# Patient Record
Sex: Female | Born: 1970
Health system: Southern US, Community
[De-identification: ages and names within clinical notes are randomized; demographics above are authoritative.]

## PROBLEM LIST (undated history)

## (undated) DIAGNOSIS — E039 Hypothyroidism, unspecified: Secondary | ICD-10-CM

## (undated) DIAGNOSIS — E785 Hyperlipidemia, unspecified: Secondary | ICD-10-CM

## (undated) DIAGNOSIS — C439 Malignant melanoma of skin, unspecified: Secondary | ICD-10-CM

## (undated) DIAGNOSIS — C50919 Malignant neoplasm of unspecified site of unspecified female breast: Secondary | ICD-10-CM

## (undated) DIAGNOSIS — K222 Esophageal obstruction: Secondary | ICD-10-CM

## (undated) DIAGNOSIS — I10 Essential (primary) hypertension: Secondary | ICD-10-CM

## (undated) HISTORY — DX: Hypothyroidism, unspecified: E03.9

## (undated) HISTORY — PX: APPENDECTOMY: SHX54

## (undated) HISTORY — DX: Hyperlipidemia, unspecified: E78.5

## (undated) HISTORY — PX: THORACOTOMY: SUR1349

## (undated) HISTORY — DX: Essential (primary) hypertension: I10

## (undated) HISTORY — DX: Malignant neoplasm of unspecified site of unspecified female breast: C50.919

## (undated) HISTORY — PX: THYROIDECTOMY: SHX17

## (undated) HISTORY — PX: TUBAL LIGATION: SHX77

## (undated) HISTORY — PX: NOVASURE ABLATION: SHX5394

## (undated) HISTORY — PX: SPLENECTOMY: SUR1306

## (undated) HISTORY — PX: MASTECTOMY: SHX3

## (undated) HISTORY — PX: ESOPHAGEAL DILATION: SHX303

## (undated) HISTORY — PX: OTHER SURGICAL HISTORY: SHX169

## (undated) HISTORY — DX: Esophageal obstruction: K22.2

## (undated) HISTORY — DX: Malignant melanoma of skin, unspecified: C43.9

## (undated) HISTORY — PX: TONSILLECTOMY: SUR1361

---

## 1998-02-26 ENCOUNTER — Other Ambulatory Visit: Admission: RE | Admit: 1998-02-26 | Discharge: 1998-02-26 | Payer: Self-pay | Admitting: Obstetrics and Gynecology

## 1998-05-17 ENCOUNTER — Ambulatory Visit (HOSPITAL_COMMUNITY): Admission: RE | Admit: 1998-05-17 | Discharge: 1998-05-17 | Payer: Self-pay | Admitting: Gastroenterology

## 1999-03-02 ENCOUNTER — Other Ambulatory Visit: Admission: RE | Admit: 1999-03-02 | Discharge: 1999-03-02 | Payer: Self-pay | Admitting: Obstetrics and Gynecology

## 1999-04-07 ENCOUNTER — Encounter (INDEPENDENT_AMBULATORY_CARE_PROVIDER_SITE_OTHER): Payer: Self-pay

## 1999-04-07 ENCOUNTER — Ambulatory Visit (HOSPITAL_COMMUNITY): Admission: RE | Admit: 1999-04-07 | Discharge: 1999-04-07 | Payer: Self-pay | Admitting: Surgery

## 1999-08-08 ENCOUNTER — Encounter: Admission: RE | Admit: 1999-08-08 | Discharge: 1999-08-08 | Payer: Self-pay | Admitting: Oncology

## 2000-01-26 ENCOUNTER — Encounter: Admission: RE | Admit: 2000-01-26 | Discharge: 2000-01-26 | Payer: Self-pay | Admitting: Oncology

## 2000-01-26 ENCOUNTER — Encounter: Payer: Self-pay | Admitting: Oncology

## 2000-03-22 ENCOUNTER — Other Ambulatory Visit: Admission: RE | Admit: 2000-03-22 | Discharge: 2000-03-22 | Payer: Self-pay | Admitting: Obstetrics and Gynecology

## 2000-06-13 ENCOUNTER — Encounter (HOSPITAL_COMMUNITY): Payer: Self-pay | Admitting: Oncology

## 2000-06-13 ENCOUNTER — Encounter: Admission: RE | Admit: 2000-06-13 | Discharge: 2000-06-13 | Payer: Self-pay | Admitting: Oncology

## 2000-07-20 ENCOUNTER — Encounter (HOSPITAL_COMMUNITY): Payer: Self-pay | Admitting: Oncology

## 2000-07-20 ENCOUNTER — Encounter: Admission: RE | Admit: 2000-07-20 | Discharge: 2000-07-20 | Payer: Self-pay | Admitting: Oncology

## 2000-12-19 ENCOUNTER — Encounter (HOSPITAL_COMMUNITY): Payer: Self-pay | Admitting: Oncology

## 2000-12-19 ENCOUNTER — Encounter: Admission: RE | Admit: 2000-12-19 | Discharge: 2000-12-19 | Payer: Self-pay | Admitting: Oncology

## 2001-03-21 ENCOUNTER — Other Ambulatory Visit: Admission: RE | Admit: 2001-03-21 | Discharge: 2001-03-21 | Payer: Self-pay | Admitting: Obstetrics and Gynecology

## 2001-04-01 ENCOUNTER — Encounter: Admission: RE | Admit: 2001-04-01 | Discharge: 2001-04-01 | Payer: Self-pay | Admitting: Oncology

## 2001-04-01 ENCOUNTER — Encounter (HOSPITAL_COMMUNITY): Admission: RE | Admit: 2001-04-01 | Discharge: 2001-05-01 | Payer: Self-pay | Admitting: Oncology

## 2001-12-30 ENCOUNTER — Ambulatory Visit (HOSPITAL_COMMUNITY): Admission: RE | Admit: 2001-12-30 | Discharge: 2001-12-30 | Payer: Self-pay | Admitting: Gastroenterology

## 2002-01-08 ENCOUNTER — Encounter: Admission: RE | Admit: 2002-01-08 | Discharge: 2002-01-08 | Payer: Self-pay | Admitting: Oncology

## 2002-01-28 ENCOUNTER — Ambulatory Visit (HOSPITAL_COMMUNITY): Admission: RE | Admit: 2002-01-28 | Discharge: 2002-01-28 | Payer: Self-pay | Admitting: Gastroenterology

## 2002-03-24 ENCOUNTER — Encounter: Admission: RE | Admit: 2002-03-24 | Discharge: 2002-03-24 | Payer: Self-pay | Admitting: Oncology

## 2002-03-24 ENCOUNTER — Encounter (HOSPITAL_COMMUNITY): Payer: Self-pay | Admitting: Oncology

## 2002-04-18 ENCOUNTER — Ambulatory Visit (HOSPITAL_COMMUNITY): Admission: RE | Admit: 2002-04-18 | Discharge: 2002-04-18 | Payer: Self-pay | Admitting: Gastroenterology

## 2002-08-29 ENCOUNTER — Ambulatory Visit (HOSPITAL_COMMUNITY): Admission: RE | Admit: 2002-08-29 | Discharge: 2002-08-29 | Payer: Self-pay | Admitting: Gastroenterology

## 2002-10-14 ENCOUNTER — Other Ambulatory Visit: Admission: RE | Admit: 2002-10-14 | Discharge: 2002-10-14 | Payer: Self-pay | Admitting: Obstetrics and Gynecology

## 2004-03-07 ENCOUNTER — Encounter: Admission: RE | Admit: 2004-03-07 | Discharge: 2004-03-07 | Payer: Self-pay | Admitting: Oncology

## 2004-03-07 ENCOUNTER — Encounter (HOSPITAL_COMMUNITY): Admission: RE | Admit: 2004-03-07 | Discharge: 2004-04-06 | Payer: Self-pay | Admitting: Oncology

## 2004-07-26 ENCOUNTER — Other Ambulatory Visit: Admission: RE | Admit: 2004-07-26 | Discharge: 2004-07-26 | Payer: Self-pay | Admitting: Obstetrics and Gynecology

## 2005-03-14 ENCOUNTER — Ambulatory Visit: Payer: Self-pay | Admitting: Oncology

## 2005-03-17 ENCOUNTER — Encounter: Admission: RE | Admit: 2005-03-17 | Discharge: 2005-03-17 | Payer: Self-pay | Admitting: Oncology

## 2005-03-17 ENCOUNTER — Ambulatory Visit (HOSPITAL_COMMUNITY): Payer: Self-pay | Admitting: Oncology

## 2005-09-13 ENCOUNTER — Other Ambulatory Visit: Admission: RE | Admit: 2005-09-13 | Discharge: 2005-09-13 | Payer: Self-pay | Admitting: Obstetrics and Gynecology

## 2005-10-30 ENCOUNTER — Ambulatory Visit: Payer: Self-pay | Admitting: Cardiology

## 2005-11-28 ENCOUNTER — Ambulatory Visit: Payer: Self-pay | Admitting: Cardiology

## 2006-02-21 ENCOUNTER — Encounter: Payer: Self-pay | Admitting: Surgery

## 2006-03-02 ENCOUNTER — Ambulatory Visit: Payer: Self-pay | Admitting: Cardiology

## 2006-03-02 ENCOUNTER — Encounter: Admission: RE | Admit: 2006-03-02 | Discharge: 2006-03-02 | Payer: Self-pay | Admitting: Oncology

## 2006-03-02 ENCOUNTER — Ambulatory Visit (HOSPITAL_COMMUNITY): Payer: Self-pay | Admitting: Oncology

## 2006-03-02 ENCOUNTER — Encounter (HOSPITAL_COMMUNITY): Admission: RE | Admit: 2006-03-02 | Discharge: 2006-04-01 | Payer: Self-pay | Admitting: Oncology

## 2006-04-18 ENCOUNTER — Ambulatory Visit: Payer: Self-pay

## 2006-04-18 ENCOUNTER — Encounter: Payer: Self-pay | Admitting: Internal Medicine

## 2006-08-22 ENCOUNTER — Encounter (HOSPITAL_COMMUNITY): Admission: RE | Admit: 2006-08-22 | Discharge: 2006-09-21 | Payer: Self-pay | Admitting: Oncology

## 2006-08-22 ENCOUNTER — Encounter: Admission: RE | Admit: 2006-08-22 | Discharge: 2006-08-22 | Payer: Self-pay | Admitting: Oncology

## 2007-02-22 ENCOUNTER — Ambulatory Visit (HOSPITAL_COMMUNITY): Payer: Self-pay | Admitting: Oncology

## 2007-02-22 ENCOUNTER — Encounter (HOSPITAL_COMMUNITY): Admission: RE | Admit: 2007-02-22 | Discharge: 2007-03-24 | Payer: Self-pay | Admitting: Oncology

## 2007-03-04 ENCOUNTER — Ambulatory Visit: Payer: Self-pay | Admitting: Hematology

## 2007-03-04 ENCOUNTER — Ambulatory Visit: Payer: Self-pay | Admitting: Oncology

## 2007-05-30 ENCOUNTER — Ambulatory Visit: Payer: Self-pay | Admitting: Cardiology

## 2007-08-20 ENCOUNTER — Encounter (INDEPENDENT_AMBULATORY_CARE_PROVIDER_SITE_OTHER): Payer: Self-pay | Admitting: Obstetrics and Gynecology

## 2007-08-20 ENCOUNTER — Ambulatory Visit (HOSPITAL_COMMUNITY): Admission: RE | Admit: 2007-08-20 | Discharge: 2007-08-20 | Payer: Self-pay | Admitting: Obstetrics and Gynecology

## 2008-03-02 ENCOUNTER — Ambulatory Visit (HOSPITAL_COMMUNITY): Payer: Self-pay | Admitting: Oncology

## 2008-08-07 ENCOUNTER — Ambulatory Visit: Payer: Self-pay | Admitting: Cardiology

## 2009-02-24 ENCOUNTER — Encounter (HOSPITAL_COMMUNITY): Admission: RE | Admit: 2009-02-24 | Discharge: 2009-03-26 | Payer: Self-pay | Admitting: Oncology

## 2009-02-24 ENCOUNTER — Ambulatory Visit (HOSPITAL_COMMUNITY): Payer: Self-pay | Admitting: Oncology

## 2009-02-24 ENCOUNTER — Encounter (INDEPENDENT_AMBULATORY_CARE_PROVIDER_SITE_OTHER): Payer: Self-pay | Admitting: *Deleted

## 2009-02-24 ENCOUNTER — Encounter: Payer: Self-pay | Admitting: Cardiology

## 2009-02-24 LAB — CONVERTED CEMR LAB
ALT: 65 units/L
AST: 54 units/L
Chloride: 102 meq/L
Cholesterol: 259 mg/dL
Glucose, Bld: 89 mg/dL
LDL Cholesterol: 183 mg/dL
Potassium: 4.4 meq/L
Total Bilirubin: 0.8 mg/dL
Triglyceride fasting, serum: 175 mg/dL

## 2009-03-01 ENCOUNTER — Encounter (INDEPENDENT_AMBULATORY_CARE_PROVIDER_SITE_OTHER): Payer: Self-pay | Admitting: *Deleted

## 2009-04-05 ENCOUNTER — Encounter (INDEPENDENT_AMBULATORY_CARE_PROVIDER_SITE_OTHER): Payer: Self-pay | Admitting: Obstetrics and Gynecology

## 2009-04-05 ENCOUNTER — Ambulatory Visit (HOSPITAL_COMMUNITY): Admission: RE | Admit: 2009-04-05 | Discharge: 2009-04-06 | Payer: Self-pay | Admitting: Obstetrics and Gynecology

## 2009-06-03 ENCOUNTER — Encounter (INDEPENDENT_AMBULATORY_CARE_PROVIDER_SITE_OTHER): Payer: Self-pay | Admitting: *Deleted

## 2009-10-05 DIAGNOSIS — E039 Hypothyroidism, unspecified: Secondary | ICD-10-CM | POA: Insufficient documentation

## 2009-10-05 DIAGNOSIS — Z8571 Personal history of Hodgkin lymphoma: Secondary | ICD-10-CM | POA: Insufficient documentation

## 2009-10-05 DIAGNOSIS — I10 Essential (primary) hypertension: Secondary | ICD-10-CM

## 2009-10-05 DIAGNOSIS — Z9010 Acquired absence of unspecified breast and nipple: Secondary | ICD-10-CM | POA: Insufficient documentation

## 2009-10-05 DIAGNOSIS — Z853 Personal history of malignant neoplasm of breast: Secondary | ICD-10-CM | POA: Insufficient documentation

## 2009-10-12 ENCOUNTER — Telehealth: Payer: Self-pay | Admitting: Cardiology

## 2009-11-12 ENCOUNTER — Ambulatory Visit: Payer: Self-pay | Admitting: Cardiology

## 2009-11-12 DIAGNOSIS — E785 Hyperlipidemia, unspecified: Secondary | ICD-10-CM | POA: Insufficient documentation

## 2010-04-26 ENCOUNTER — Encounter (HOSPITAL_COMMUNITY): Admission: RE | Admit: 2010-04-26 | Discharge: 2010-05-26 | Payer: Self-pay | Admitting: Oncology

## 2010-04-26 ENCOUNTER — Ambulatory Visit (HOSPITAL_COMMUNITY): Payer: Self-pay | Admitting: Oncology

## 2010-04-26 ENCOUNTER — Encounter: Payer: Self-pay | Admitting: Cardiology

## 2010-11-07 ENCOUNTER — Encounter: Payer: Self-pay | Admitting: Cardiology

## 2010-11-07 ENCOUNTER — Ambulatory Visit
Admission: RE | Admit: 2010-11-07 | Discharge: 2010-11-07 | Payer: Self-pay | Source: Home / Self Care | Attending: Cardiology | Admitting: Cardiology

## 2010-11-24 NOTE — Assessment & Plan Note (Signed)
Summary: ROV/ GD  Medications Added ASPIRIN 81 MG TBEC (ASPIRIN) Take one tablet by mouth daily MULTIVITAMINS   TABS (MULTIPLE VITAMIN) 1 tab qd CALCIUM CARBONATE   POWD (CALCIUM CARBONATE) 1 once daily FISH OIL 1200 MG CAPS (OMEGA-3 FATTY ACIDS) once daily      Allergies Added:   Visit Type:  Follow-up Primary Provider:  Dr Carlena Bjornstad  CC:  some chest discomfort may be muscle related. Pt had hystertomy last June.Marland Kitchen  History of Present Illness: Brooke Gomez comes in today for evaluation and management of her hypertension. She also brings in some lipid values drawn on the outside.  She's having no chest pain, palpitations, dyspnea or exertion. Her blood pressure done under good control about 120/80.  Recent cholesterol was 259, LDL was 41, total cholesterol ratio ratio was 6.3, LDL was 183, triglycerides 175. She would like to start with facial and can't avoid a statin. 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 Her last treatment was about 25 years ago.  Current Medications (verified): 1)  Synthroid 137 Mcg Tabs (Levothyroxine Sodium) .Marland Kitchen.. 1 Tab Once Daily 2)  Prilosec 20 Mg Cpdr (Omeprazole) .Marland Kitchen.. 1 Tab Once Daily 3)  Lotrel 5-20 Mg Caps (Amlodipine Besy-Benazepril Hcl) .Marland Kitchen.. 1 Tab Once Daily 4)  Aspirin 81 Mg Tbec (Aspirin) .... Take One Tablet By Mouth Daily 5)  Multivitamins   Tabs (Multiple Vitamin) .Marland Kitchen.. 1 Tab Qd 6)  Calcium Carbonate   Powd (Calcium Carbonate) .Marland Kitchen.. 1 Once Daily  Allergies (verified): 1)  ! Pcn 2)  ! 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 Appendectomy Spleenectomy Thoracotomy Thyroidectomy multiple lymph node biopsies  Family History: Last updated: 10/05/2009 Father: alive Mother: alive Siblings: sister alive  Social History: Last updated: 10/05/2009 Full Time.Marland KitchenPA for Cardiothoracic Surgery at [employer]  Tobacco Use - No.  Alcohol Use - yes..1 glass of wine daily Regular Exercise - no Drug Use - no  Risk Factors: Exercise: no (10/05/2009)  Risk Factors: Smoking Status: never (10/05/2009)  Review of Systems       negative other than history of present illness  Vital Signs:  Patient profile:   40 year old female Height:      62 inches Weight:      151 pounds BMI:     27.72 Pulse rate:   85 / minute BP sitting:   128 / 82  (left arm) Cuff size:   regular  Vitals Entered By: Burnett Kanaris, CNA (November 12, 2009 2:55 PM)  Physical Exam  General:  Well developed, well nourished, in no acute distress. Head:  normocephalic and atraumatic Eyes:  PERRLA/EOM intact; conjunctiva and lids normal. Mouth:  Teeth, gums and palate normal. Oral mucosa normal. Neck:  Neck supple, no JVD. No masses, thyromegaly or abnormal cervical nodes. Chest Jetta Murray:  no deformities or breast masses noted Breasts:  bilateral implant Lungs:  Clear bilaterally to auscultation and percussion. Heart:  normal S1-S2 no gallop rub or murmur. Abdomen:  Bowel sounds positive; abdomen soft and non-tender without masses, organomegaly, or hernias noted. No hepatosplenomegaly. Msk:  Back normal, normal gait. Muscle strength and tone normal. Pulses:  pulses  normal in all 4 extremities Extremities:  No clubbing or cyanosis. Neurologic:  Alert and oriented x 3. Skin:  Intact without lesions or rashes. Psych:  great attitude, very positive   Problems:  Medical Problems Added: 1)  Dx of Hyperlipidemia-mixed  (ICD-272.4) 2)  Dx of Hyperlipidemia-mixed  (ICD-272.4)  EKG  Procedure date:   11/12/2009  Findings:      normal sinus rhythm, RSR from V1 V2, right atrial margin, no change  Impression & Recommendations:  Problem # 1:  HYPERTENSION (ICD-401.9) Assessment Improved  Her updated medication list for this problem includes:    Lotrel 5-20 Mg Caps (Amlodipine besy-benazepril hcl) .Marland Kitchen... 1 tab once daily    Aspirin 81 Mg Tbec (Aspirin) .Marland Kitchen... Take one tablet by mouth daily  Her updated medication list for this problem includes:    Lotrel 5-20 Mg Caps (Amlodipine besy-benazepril hcl) .Marland Kitchen... 1 tab once daily    Aspirin 81 Mg Tbec (Aspirin) .Marland Kitchen... Take one tablet by mouth daily  Problem # 2:  HYPERLIPIDEMIA-MIXED (ICD-272.4) Assessment: Deteriorated Ihave had a long discussion with her today. We will start with fish oil 1200 mg per day. She will be an exercise program 3 hours of cardio week. She will try to lose 10 pounds. We will repeat lipids at that time and did see her back in the office. I will have a low threshold for starting a statin as I told her today.  Patient Instructions: 1)  Your physician recommends that you schedule a follow-up appointment in:  6 MONTHS WITH DR  Sheranda Seabrooks 2)  Your physician recommends that you return for lab work in:6 MONTHS LIPID  3)  Your physician has recommended you make the following change in your medication: START FISH OIL 1200 MG DAILY 4)  Your physician encouraged you to lose weight for better health.LOSE 10 POUNDS  5)  Your physician discussed the importance of regular exercise and recommended that you start or continue a regular exercise program for good health. 3 HOURS A WEEK EXCERCISE

## 2010-11-24 NOTE — Assessment & Plan Note (Signed)
Summary: follow up appt/mt  Medications Added LEVOTHYROXINE SODIUM 125 MCG TABS (LEVOTHYROXINE SODIUM) 1 tab once daily DEXILANT 60 MG CPDR (DEXLANSOPRAZOLE) 1 cap once daily ASPIRIN 81 MG TBEC (ASPIRIN) 2 tabs once daily FISH OIL 1360 MG CAPS (OMEGA-3 FATTY ACIDS) 1 cap once daily VALTREX 500 MG TABS (VALACYCLOVIR HCL) as needed        Visit Type:  Follow-up Primary Provider:  Dr Carlena Bjornstad  CC:  pt denies any cardiac complaints today.  History of Present Illness: Ms Brooke Gomez returns for evaluation and management her hypertension, mixed hyperlipidemia and history of chest pain.  She offers no complaints today. She is upset that her weight has gone up about 6 pounds. She's not exercising on a regular basis. However her lipids look better in July with a total cholesterol of 2:15, triglycerides 167, HDL of 55, LDL 127, total cholesterol HDL ratio of 3.9. She says that her comprehensive metabolic panel was normal but do not have a copy. TSH was normal. CBC was normal.  She says her blood pressures been under much better control. She switched jobs and is now working daytime hours only.  Current Medications (verified): 1)  Levothyroxine Sodium 125 Mcg Tabs (Levothyroxine Sodium) .Marland Kitchen.. 1 Tab Once Daily 2)  Dexilant 60 Mg Cpdr (Dexlansoprazole) .Marland Kitchen.. 1 Cap Once Daily 3)  Lotrel 5-20 Mg Caps (Amlodipine Besy-Benazepril Hcl) .Marland Kitchen.. 1 Tab Once Daily 4)  Aspirin 81 Mg Tbec (Aspirin) .... 2 Tabs Once Daily 5)  Multivitamins   Tabs (Multiple Vitamin) .Marland Kitchen.. 1 Tab Qd 6)  Calcium Carbonate   Powd (Calcium Carbonate) .Marland Kitchen.. 1 Once Daily 7)  Fish Oil 1360 Mg Caps (Omega-3 Fatty Acids) .Marland Kitchen.. 1 Cap Once Daily 8)  Valtrex 500 Mg Tabs (Valacyclovir Hcl) .... As Needed  Allergies: 1)  ! Pcn 2)  ! * Hibiclens  Past History:  Past Medical History: Last updated: 10/05/2009 HYPERTENSION (ICD-401.9) MASTECTOMY, BILATERAL, HX OF (ICD-V45.71) ADENOCARCINOMA, BREAST, HX OF (ICD-V10.3) PERSONAL HISTORY OF  HODGKINS DISEASE (ICD-V10.72) HYPOTHYROIDISM (ICD-244.9)      Past Surgical History: Last updated: 10/05/2009 status post tubal ligation NovaSure endometrial ablation for pelvic pain and menorrhagia. 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  Alcohol Use - yes..1 glass of wine daily Regular Exercise - no Drug Use - no  Risk Factors: Exercise: no (10/05/2009)  Risk Factors: Smoking Status: never (10/05/2009)  Review of Systems       negative and history of present illness  Vital Signs:  Patient profile:   40 year old female Height:      62 inches Weight:      156.75 pounds BMI:     28.77 Pulse rate:   82 / minute Pulse rhythm:   irregular BP sitting:   128 / 80  (left arm) Cuff size:   large  Vitals Entered By: Danielle Rankin, CMA (November 07, 2010 10:39 AM)  Physical Exam  General:  overweight, Head:  normocephalic and atraumatic Eyes:  PERRLA/EOM intact; conjunctiva and lids normal. Neck:  Neck supple, no JVD. No masses, thyromegaly or abnormal cervical nodes. Breasts:  bilateral implants Lungs:  Clear bilaterally to auscultation and percussion. Heart:  PMI nondisplaced, regular rate and rhythm, normal S1-S2, soft systolic sound right carotid Msk:  Back normal, normal gait. Muscle strength and tone normal. Pulses:  pulses  normal in all 4 extremities Extremities:  No clubbing or cyanosis. Neurologic:  Alert and oriented x 3. Skin:  Intact without lesions or rashes. Psych:  Normal affect.   Impression & Recommendations:  Problem # 1:  HYPERLIPIDEMIA-MIXED (ICD-272.4) Assessment Improved  Problem # 2:  HYPERTENSION (ICD-401.9) Assessment: Improved  Her updated medication list for this problem  includes:    Lotrel 5-20 Mg Caps (Amlodipine besy-benazepril hcl) .Marland Kitchen... 1 tab once daily    Aspirin 81 Mg Tbec (Aspirin) .Marland Kitchen... 2 tabs once daily  Orders: EKG w/ Interpretation (93000)  Patient Instructions: 1)  Your physician recommends that you schedule a follow-up appointment in: 12 months 2)  Your physician recommends that you continue on your current medications as directed. Please refer to the Current Medication list given to you today.

## 2010-11-24 NOTE — Letter (Signed)
Summary: [facility] Office Progress Note   [facility] Office Progress Note   Imported By: Roderic Ovens 05/16/2010 16:19:28  _____________________________________________________________________  External Attachment:    Type:   Image     Comment:   External Document

## 2010-12-05 ENCOUNTER — Other Ambulatory Visit: Payer: Self-pay | Admitting: Gastroenterology

## 2011-01-08 LAB — DIFFERENTIAL
Basophils Absolute: 0.2 10*3/uL — ABNORMAL HIGH (ref 0.0–0.1)
Lymphocytes Relative: 40 % (ref 12–46)
Lymphs Abs: 3.8 10*3/uL (ref 0.7–4.0)
Neutro Abs: 4.5 10*3/uL (ref 1.7–7.7)
Neutrophils Relative %: 47 % (ref 43–77)

## 2011-01-08 LAB — LIPID PANEL
Cholesterol: 215 mg/dL — ABNORMAL HIGH (ref 0–200)
HDL: 55 mg/dL (ref >39)
LDL Cholesterol: 127 mg/dL — ABNORMAL HIGH (ref 0–99)
Triglycerides: 167 mg/dL — ABNORMAL HIGH (ref <150)

## 2011-01-08 LAB — CBC
Platelets: 459 10*3/uL — ABNORMAL HIGH (ref 150–400)
RBC: 4.34 MIL/uL (ref 3.87–5.11)
RDW: 14.2 % (ref 11.5–15.5)
WBC: 9.5 10*3/uL (ref 4.0–10.5)

## 2011-01-25 ENCOUNTER — Ambulatory Visit (HOSPITAL_COMMUNITY): Payer: Self-pay | Admitting: Oncology

## 2011-01-30 LAB — COMPREHENSIVE METABOLIC PANEL
Albumin: 4.1 g/dL (ref 3.5–5.2)
BUN: 12 mg/dL (ref 6–23)
Calcium: 8.8 mg/dL (ref 8.4–10.5)
Chloride: 104 mEq/L (ref 96–112)
Creatinine, Ser: 0.58 mg/dL (ref 0.4–1.2)
Total Bilirubin: 0.8 mg/dL (ref 0.3–1.2)

## 2011-01-30 LAB — CBC
HCT: 39.4 % (ref 36.0–46.0)
Hemoglobin: 12.1 g/dL (ref 12.0–15.0)
MCHC: 34.6 g/dL (ref 30.0–36.0)
MCHC: 35 g/dL (ref 30.0–36.0)
MCV: 96.2 fL (ref 78.0–100.0)
Platelets: 395 10*3/uL (ref 150–400)
RBC: 3.61 MIL/uL — ABNORMAL LOW (ref 3.87–5.11)
RDW: 14.4 % (ref 11.5–15.5)
WBC: 10.7 10*3/uL — ABNORMAL HIGH (ref 4.0–10.5)

## 2011-01-31 LAB — CBC
Hemoglobin: 14.1 g/dL (ref 12.0–15.0)
MCHC: 34.1 g/dL (ref 30.0–36.0)
MCV: 94.2 fL (ref 78.0–100.0)
RBC: 4.4 MIL/uL (ref 3.87–5.11)

## 2011-01-31 LAB — TSH: TSH: 9.54 u[IU]/mL — ABNORMAL HIGH (ref 0.350–4.500)

## 2011-01-31 LAB — COMPREHENSIVE METABOLIC PANEL
CO2: 26 mEq/L (ref 19–32)
Calcium: 9.3 mg/dL (ref 8.4–10.5)
Creatinine, Ser: 0.55 mg/dL (ref 0.4–1.2)
GFR calc non Af Amer: >60 mL/min (ref >60)
Glucose, Bld: 89 mg/dL (ref 70–99)

## 2011-01-31 LAB — LIPID PANEL
Cholesterol: 259 mg/dL — ABNORMAL HIGH (ref 0–200)
HDL: 41 mg/dL (ref >39)
Triglycerides: 175 mg/dL — ABNORMAL HIGH (ref <150)

## 2011-01-31 LAB — DIFFERENTIAL
Lymphocytes Relative: 34 % (ref 12–46)
Lymphs Abs: 2.7 10*3/uL (ref 0.7–4.0)
Neutrophils Relative %: 53 % (ref 43–77)

## 2011-03-07 NOTE — Op Note (Signed)
NAMEALEINA, BURGIO          ACCOUNT NO.:  [id number]   MEDICAL RECORD NO.:  [id number]          PATIENT TYPE:  OIB   LOCATION:  9304                          FACILITY:  [facility]   PHYSICIAN:  Dineen Kid. Rana Snare, M.D.    DATE OF BIRTH:  December 24, 1970   DATE OF PROCEDURE:  04/05/2009  DATE OF DISCHARGE:                               OPERATIVE REPORT   PREOPERATIVE DIAGNOSES:  Abnormal uterine bleeding, pelvic pain,  dyspareunia, history of recurrent ovarian cyst, and recurrent cervical  dysplasia.   POSTOPERATIVE DIAGNOSES:  Abnormal uterine bleeding, pelvic pain,  dyspareunia, history of recurrent ovarian cyst, recurrent cervical  dysplasia,  endometriosis, and left endometrioma.   PROCEDURES:  Laparoscopic-assisted vaginal hysterectomy with left  salpingo-oophorectomy.   SURGEONS:  1. Dineen Kid Rana Snare, MD  2. Michelle L. Vincente Poli, MD   ANESTHESIA:  General endotracheal.   INDICATIONS:  Brooke Gomez is a 40 year old nulligravida white female,  status post tubal ligation, also NovaSure ablation in the past for  worsening problems with bleeding.  She had good success about a year and  half and then began having worsening problems of pelvic pain, uterine  bleeding, and cramping.  She also has a history of recurrent cervical  dysplasia.  She desires definitive surgical intervention and requests  removal of the uterus and one of the ovaries.  She has history of  recurrent ovarian cysts which vary from right to left.  She does want to  preserve one ovary.  She does have a history of breast cancer and her  oncologist recommended leaving the best ovary.  The risks and benefits  of the procedure were discussed at length and informed consent was  obtained.  See history and physical for further details.   FINDINGS AT THE TIME OF SURGERY:  A large left ovarian cyst consistent  with endometrioma, also small endometriosis implants, normal-appearing  right ovary.   DESCRIPTION OF PROCEDURE:  After  adequate analgesia, the patient was  placed in the dorsal lithotomy position.  She was sterilely prepped and  draped.  The bladder was sterilely drained.  Graves speculum was placed.  A Hulka tenaculum was placed on the cervix, 1-cm infraumbilical skin  incision was made.  A Veress needle was inserted.  The abdomen was  insufflated with dullness to percussion.  A 5-mm trocar was inserted to  the left the midline under direct visualization.  Once the laparoscope  had been inserted through an 11-mm trocar through the umbilicus.  After  careful systematic evaluation of the abdomen and pelvis, because the  large left ovarian endometrioma, an incision was made to remove the left  fallopian tube per the patient request.  The left infundibulopelvic  ligament was identified and ligated with a gyrus cutting forceps,  dissected across the infundibulopelvic ligament down to the round  ligament.  The right utero-ovarian ligament was dissected and ligated.  This sutures used to tack the ovaries.  The uterosacral ligaments were  easily cut and removing the ovaries from the uterosacral ligaments.  The  right tube and ovaries fell laterally, suture had been removed and  appeared to be normal  in appearance.  The bladder was then elevated and  the uterovesical junction was identified and a small bladder flap was  created.  The abdomen was then desufflated.  Legs were repositioned, a  posterior weighted speculum was placed, posterior colpotomy was  performed.  Cervix was circumscribed with Bovie cautery.  LigaSure  instrument was used to ligate across the uterosacral ligaments  bilaterally, the cardinal ligaments bilaterally, and the bladder pillars  were dissected with Mayo scissors.  The anterior vaginal mucosa was  dissected off the anterior cervix with the anterior peritoneum entered  sharply.  The Deaver retractor was placed underneath the bladder.  The  inferior portion of the broad ligament were  ligated with LigaSure  instrument.  The uterus and left fallopian tube were removed.  The  uterosacral ligaments were identified, ligated with a suture of 0  Monocryl suture in figure-of-eight fashion.  The posterior peritoneum  was closed in a pursestring fashion.  Vagina was then closed in a  vertical fashion using figure-of-eights of 0 Monocryl suture with good  approximation and good hemostasis achieved.  Posterior speculum was then  removed, a small laceration midway at the vagina approximately 1 cm in  length was cauterized with Bovie cautery with good hemostasis achieved.  Foley catheter was placed with return of clear yellow urine.  Legs were  repositioned.  The abdomen was reinsufflated.  Reexamination of  laparoscope revealed good hemostasis.  Small peritoneal edge bleeders  and pedicles were electively ligated with a bipolar cautery, and after  copious amount of irrigation, adequate hemostasis was assured.  The  trocars were  removed.  The umbilical incision was closed with 0 Vicryl  interrupted suture.  The fascia with 3-0 Vicryl Rapide subcuticular  suture, 5-mm site was closed with 3-0 Vicryl Rapide subcuticular suture,  and Dermabond.  The incision were injected with 0.25% Marcaine, total of  10 mL used.  The patient was transferred to recovery room in stable  condition.  Sponge and instruments count was normal x3.  Estimated blood  loss was 100 mL.  The patient received clindamycin 900 mg and gentamicin  100 mg IV preoperatively.      Dineen Kid Rana Snare, M.D.  Electronically Signed     DCL/MEDQ  D:  04/05/2009  T:  04/05/2009  Job:  [id number]

## 2011-03-07 NOTE — H&P (Signed)
NAMEHARBOR, PASTER          ACCOUNT NO.:  [id number]   MEDICAL RECORD NO.:  [id number]          PATIENT TYPE:  AMB   LOCATION:  SDC                           FACILITY:  [facility]   PHYSICIAN:  Dineen Kid. Rana Snare, M.D.    DATE OF BIRTH:  [birth year]   DATE OF ADMISSION:  DATE OF DISCHARGE:                              HISTORY & PHYSICAL   HISTORY OF PRESENT ILLNESS:  Ms. Brooke Gomez is a 40 year old nulligravida  white female, status post tubal ligation, also status post NovaSure  endometrial ablation.  She has been having worsening problems with  pelvic pain, uterine bleeding and cramping and history of recurrent  dysplasia.  She had undergone tubal ligation and endometrial ablation  back in October 2008 for menorrhagia and dysmenorrhea.  Had good results  for approximately 1-1/2 years.  Over this last year, she had been having  more problems with pain on a regular basis, cramping, bleeding and also  pain with intercourse.  Has not responded to conservative medical  management.  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  She also  has a recent right ovarian cyst which ultrasound in January shows a 6.7  cm mass consistent with benign functional cyst.  She presents today for  hysterectomy and unilateral salpingo-oophorectomy.   PAST MEDICAL HISTORY:  1. History of hypertension.  2. Hypothyroidism.  3. History of Hodgkin's disease.  4. History of breast cancer.  She is status post bilateral      mastectomies.  5. She also has had splenectomy and appendectomy.  6. She also had a thoracotomy.  7. Thyroidectomy.  8. She had multiple lymph node biopsies.  9. She had a tubal ligation and endometrial ablation.   ALLERGIES:  HIBICLENS, PENICILLIN and cannot take AMOXICILLIN.   MEDICATIONS:  1. Synthroid on 137 mcg daily.  2. Prilosec 20 mg a day.  3. Prozac 40 mg daily.  4. Lotrel 5/20 mg daily.  Blood pressure is  118/72.   PHYSICAL EXAMINATION:  HEART:  Regular rate and rhythm.  LUNGS:  Clear to auscultation bilaterally.  ABDOMEN:  Nondistended, nontender.  No rebound or guarding.  PELVIC EXAM:  Pelvic exam she has normal external genitalia.  BUS and  cervix appears to be normal.  Uterus anteverted, mobile, minimally  tender to deep palpation.  Some fullness of the right adnexa but it is  not tender.   IMPRESSION AND PLAN:  1. Abnormal uterine bleeding.  2. Pelvic pain.  3. Dyspareunia.  4. Right ovarian cyst.  5. Recurrent cervical dysplasia.   Devlin has reached a point where she wants a hysterectomy.  She has  tried conservative medical management which has included ablation and  also now anti-inflammatory medications and finds this is disruptive for  her life  both socially, physically and financially.  She wishes  hysterectomy and would like to proceed with a possible LAVH with a  unilateral salpingo-oophorectomy.  She has spoken with her oncologist  with regards to this since she does have a history of Hodgkin's and also  breast  cancer.  He did recommend leaving one ovary.  We will plan  evaluate both ovaries and see which one looks best.  If the cyst  __________  the right side, we will make a decision at that time which  one to leave.  If she does have a cyst on both ovaries, she really does  not have __________ .  I discussed the procedure at length, its risks  and its benefits.  Discussed the possibility that this may not alleviate  the pain.  It could recur or worsen.  Discussed the association with  early menopause, see if we can take out one ovary.  Discussed the risks  associated, bleeding, damage to bowel, bladder, ureters, ovaries, risks  associated with anesthesia, risks associated blood transfusion.  She  will need to continue to get Pap smears of the vagina after this.  She  does give her informed consent and wished  to proceed.      Dineen Kid Rana Snare, M.D.   Electronically Signed     DCL/MEDQ  D:  04/04/2009  T:  04/05/2009  Job:  [id number]

## 2011-03-07 NOTE — Discharge Summary (Signed)
NAMESHALETA, Brooke Gomez          ACCOUNT NO.:  [id number]   MEDICAL RECORD NO.:  [id number]          PATIENT TYPE:  OIB   LOCATION:  9304                          FACILITY:  [facility]   PHYSICIAN:  Dineen Kid. Rana Snare, M.D.    DATE OF BIRTH:  [birth year]   DATE OF ADMISSION:  04/05/2009  DATE OF DISCHARGE:                               DISCHARGE SUMMARY   HISTORY OF PRESENT ILLNESS:  [name] is a 40 year old nulligravida  white female, status post tubal ligation, also NovaSure endometrial  ablation for pelvic pain and menorrhagia.  She did well for  approximately 2 years, began having worsening problems with pain and  bleeding, has history of endometriosis, also history of recurrent  cervical dysplasia.  She desires definitive surgical intervention,  requests hysterectomy, and also removal of 1 of fallopian tubes and  ovaries.  She does have a history of bilateral breast cancer.  Oncology  recommended conservating 1 of the ovaries and taken out the worst ovary.   HOSPITAL COURSE:  The patient underwent a laparoscopic-assisted vaginal  hysterectomy with removal of the left fallopian tube and ovary.  She did  have a large endometrioma on the left ovary removal at the time of  surgery.  The surgery was uncomplicated with estimated blood loss of 100  mL.  Her postoperative care was unremarkable.  She had a good return of  bowel function and ambulation.  Her Foley was removed the night of  surgery.  She was able to void without difficulty, but tolerated regular  diet without difficulty, and was requiring only Vicodin for pain relief.  On postop day #1 had a hemoglobin of 12.1, had remained stable and  afebrile throughout the hospital course.  Normoactive bowel sounds.  Incision was clean, dry, and intact.  The patient was discharged home  and follow up in the office in 1-2 weeks.   DISPOSITION:  The patient will be discharged home.  Follow up in the  office in 1-2 weeks.  Told to return for  increased pain, fever, or  bleeding.  She has prescription for Vicodin, #30.      Dineen Kid Rana Snare, M.D.  Electronically Signed     DCL/MEDQ  D:  04/06/2009  T:  04/06/2009  Job:  [id number]

## 2011-03-07 NOTE — Assessment & Plan Note (Signed)
[facility]                            CARDIOLOGY OFFICE NOTE   NAME:Brooke Gomez                 MRN:          [id number]  DATE:08/07/2008                            DOB:          December 28, 1970    HISTORY OF PRESENT ILLNESS:  Brooke Gomez returns today for management  of her hypertension.  She says she had been really bad with her diet,  and has been parting a lot.  She is quite funny as usual.  She is  getting ready to go on a cruise with [employer] to [place]  over December.  She is really excited about this.   She had recent blood work obtained, which I have reviewed with her  today.  My biggest concern is her cholesterol is 238.  Her LDL was crept  up to 135.  Her HDL is 57.  Her total cholesterol and HDL ratio is 4.0.   MEDICATIONS:  1. Lotrel 5/20 daily.  2. Sarafem 20 mg a day.  3. Calcium and vitamin D daily.  4. Multivitamin daily.  5. Prilosec 20 mg daily.  6. Levothroid 125 mcg a day.   PHYSICAL EXAMINATION:  VITAL SIGNS:  Her blood pressure today is 112/74,  pulse is 85 and regular.  Her weight is 151.  HEENT:  Normal.  NECK;  Carotids upstrokes are equal bilaterally without bruits, no JVD.  Thyroid is not enlarged.  LUNGS:  Clear.  HEART:  Reveals regular rate and rhythm.  PMI was hard to appreciate.  There is no gallop.  ABDOMEN:  Soft.  EXTREMITIES:  Reveal no edema.  Pulses are intact.  NEUROLOGIC:  Intact.   EKG shows sinus rhythm with right atrial enlargement, otherwise  unremarkable.   ASSESSMENT:  Pleased with how well controlled Brooke Gomez's blood  pressure is.  I have made no changes.  We will obtain a 2-D  echocardiogram to follow up on her left ventricular function.  Question  of right sided enlargement or pulmonary hypertension and also what  degree of left ventricular hypertrophy, she may have.  Her last  echocardiogram was fairly unimpressive 2007.   Otherwise, I will see her back in a  year.    Thomas C. Daleen Squibb, MD, [facility]  Electronically Signed   TCW/MedQ  DD: 08/07/2008  DT: 08/08/2008  Job #: [id number]

## 2011-03-07 NOTE — Assessment & Plan Note (Signed)
[facility]                            CARDIOLOGY OFFICE NOTE   NAME:ELLINGTONKynedi, Profitt                 MRN:          [id number]  DATE:05/30/2007                            DOB:          [birth year]    Brooke Gomez returns today for further management of the following  issues:  Hypertension.   Please see my notes from Mar 02, 2006.  At that time we did a 2D  echocardiogram which was technically difficult because of her previous  radiation and implants.  However, there was no gross valvular  abnormalities.   She stopped her blood pressure medicine for 3 days to see if she needed  it.  Her blood pressure shot up.  It has been under good control  otherwise on Lotrel 5/20 daily.   She recently saw Dr. Glenford Peers for her Hodgkin's lymphoma followup.  She got a good report.  He did Lipids and thyroid.  I need a copy of  those results and will request them.   MEDICATIONS:  1. Levothyroid 125 mcg a day.  2. Prilosec 20 mg a day.  3. Multivitamin daily.  4. Calcium and vitamin D.  5. Sarafem 20 mg a day.  6. Lotrel 5/20 daily.   Blood pressure is 126/85, pulse 82 and regular, weight is 148 up 11.  HEENT:  Normocephalic/atraumatic, PERRLA/EOMI, sclerae are clear.  Carotid upstrokes are equal bilaterally without bruits, no JVD, thyroid  is not enlarged.  LUNGS:  Clear.  HEART:  Reveals a normal S1 and S2 that splits physiologically.  There  is no diastolic murmur.  Shows a very soft systolic murmur at the left  lower sternal border.  ABDOMINAL:  Soft with good bowel sounds.  EXTREMITIES:  Reveal no edema, pulses are intact.  NEURO:  Exam is intact.   Electrocardiogram showed sinus rhythm with a RSR prime in V1 which is  unchanged.   I think Brooke Gomez is doing well.  Will request her lab results from  Dr. Mariel Sleet.  Will give her a call on those.  I have renewed her  Lotrel for a year.  I have encouraged her to drop her weight back  down 8-  10 pounds.  I will see her back in a year.     Thomas C. Daleen Squibb, MD, [facility]  Electronically Signed    TCW/MedQ  DD: 05/30/2007  DT: 05/31/2007  Job #: [id number]   cc:   Ladona Horns. Mariel Sleet, MD  Dineen Kid Rana Snare, M.D.

## 2011-03-07 NOTE — Op Note (Signed)
Brooke Gomez, Brooke Gomez          ACCOUNT NO.:  [id number]   MEDICAL RECORD NO.:  [id number]          PATIENT TYPE:  AMB   LOCATION:  [facility]                           FACILITY:  [facility]   PHYSICIAN:  Dineen Kid. Rana Snare, M.D.    DATE OF BIRTH:  02-19-1971   DATE OF PROCEDURE:  08/20/2007  DATE OF DISCHARGE:                               OPERATIVE REPORT   PREOPERATIVE DIAGNOSIS:  Menorrhagia and desires sterility.   POSTOPERATIVE DIAGNOSIS:  Menorrhagia and desires sterility, abdominal  adhesions to anterior abdominal wall and the endometrial polyp.   PROCEDURE:  Laparoscopy with lysis of adhesions and bilateral tubal  ligation by fulguration, hysteroscopy with D&C and resection of  endometrial polyp and Novasure endometrial ablation and removal of  Mirena IUD.   SURGEON:  Dr. Candice Camp.   ANESTHESIA:  General endotracheal and also paracervical block.   INDICATIONS:  Brooke Gomez is a 40 year old nulligravida with multiple  medical problems including Hodgkin's, breast cancer, thyroid nodules,  hypertension and menorrhagia with multiple medical problems.  She has no  procedure desire for fertility in the future and desires sterility,  desires Mirena IUD removed and also evaluation and treatment of the  menorrhagia with Novasure endometrial ablation.  The above procedures  were discussed at length with the patient which include but not limited  to risk of infection, bleeding, damage to uterus, tubes ovary, bowel-  bladder, risks associated with general anesthesia, with blood  transfusion, possibility of the ablation technique may not work or could  worsen.  Did review the success rates and complication rates of the  Novasure ablation technique. Also discussed tubal failure is 5 out of  1000.  She does give her informed consent and wished to proceed.   FINDINGS AT TIME OF SURGERY:  Adhesions from the omentum and bowel to  the anterior abdominal wall on the midline incision.  Ovaries had  been  pexed to the uterus with a small Prolene suture noted near the fundus  otherwise normal-appearing uterus, cul-de-sac, bladder, ovaries and  fallopian tubes and liver also appeared to be normal.  On hysteroscopy  there was a moderate size endometrial polyp versus intracavitary fibroid  coming from the midportion of the fundus of the uterus, otherwise normal-  appearing ostia and cervix.   DESCRIPTION OF PROCEDURE:  After adequate analgesia the patient placed  in the dorsal lithotomy position.  She is sterilely prepped and draped.  Bladder sterilely drained.  A Graves speculum placed, tenaculum was  placed on the anterior lip of the cervix and a Mirena IUD was easily  removed.  A Hulka tenaculum was then inserted.  A 1-cm infraumbilical  skin incision was made.  A Veress needle was inserted to the right of  midline incision. A Visiport scope was used to enter the incision  through direct visualization.  After I inserted the scope, bipolar  cautery and Endoshears were used to dissect the omentum and bowel from  the anterior abdominal wall to allow visualization from the umbilicus  down to the pelvis.  After careful systematic evaluation of the pelvis,  right fallopian tube was identified by  the fimbriated end.  Midportion  fallopian tube grasped with bipolar cautery and cauterized over the 3-4  cm section of fallopian tube with good thermal burn noted throughout the  fallopian tube and also loss of resistance by the ohmmeter.  The left  fallopian tube was identified by the fimbriated end.  Midportion of tube  grasped.  Cauterization was carried out over 3-4 cm section of fallopian  tube with good thermal burn noted and loss resistance noted on the  ohmmeter.  The abdomen was then desufflated.  The patient was given a  breath by anesthesia as the trocar was removed.  The fascia was then  closed with 0 Vicryl interrupted suture in the fascia, 3-0 Vicryl Rapide  subcuticular suture.  The  incision was then injected 0.25% Marcaine  total of 10 mL used.  Legs were repositioned and paracervical block was  placed 1% Xylocaine with 1:100,000 epinephrine.  The uterus was sounded  to 8 cm, cervical length of 4 cm and dilated to a 27 Pratt dilator.  Hysteroscope was inserted.  The above findings were noted.  Curettage  performed.  The endometrial polyp was grasped and delivered with polyp  forceps and curettage was then performed over the base of the polyp.  Re-  examining with the hysteroscope revealed complete removal of the  endometrial polyp and normal-appearing fundus.  The Novasure device was  then inserted with a cavity length of 4 cm and the cavity width of 3.4  cm.  Cavity assessment test was activated and after passing the cavity  assessment test the device was activated at a power 75 watts for total  of 77 seconds.  The device was removed.  Reexamination of the  hysteroscope revealed good thermal cautery throughout the cavity.  No  obvious complications were noted.  The hysteroscope was then removed.  Tenaculum removed from anterior lip of the cervix.  Noted to be  hemostatic.  The patient was then transferred to recovery room in stable  condition.  Sponge and instrument count was normal x3.  Estimated blood  loss minimal was minimal.  Sorbitol deficit was 250 mL.  The patient  received clindamycin 900 mg preoperatively 30 mg IV and 30 mg IM of  Toradol postoperatively.   DISPOSITION:  The patient discharged home.  Follow-up in the office 2 to  3 weeks in have the routine instruction sheet for laparoscopy and also  D&C, told to return for increased pain, fever or bleeding, also sent  home with prescription for Tylox #30.      Dineen Kid Rana Snare, M.D.  Electronically Signed     DCL/MEDQ  D:  08/20/2007  T:  08/20/2007  Job:  [id number]

## 2011-04-03 ENCOUNTER — Other Ambulatory Visit (HOSPITAL_COMMUNITY): Payer: Self-pay | Admitting: Oncology

## 2011-04-03 ENCOUNTER — Encounter (HOSPITAL_COMMUNITY): Payer: 59 | Attending: Oncology | Admitting: Oncology

## 2011-04-03 DIAGNOSIS — C50919 Malignant neoplasm of unspecified site of unspecified female breast: Secondary | ICD-10-CM

## 2011-04-03 DIAGNOSIS — Z09 Encounter for follow-up examination after completed treatment for conditions other than malignant neoplasm: Secondary | ICD-10-CM | POA: Insufficient documentation

## 2011-04-03 DIAGNOSIS — Z8571 Personal history of Hodgkin lymphoma: Secondary | ICD-10-CM | POA: Insufficient documentation

## 2011-04-03 DIAGNOSIS — Z79899 Other long term (current) drug therapy: Secondary | ICD-10-CM | POA: Insufficient documentation

## 2011-04-03 DIAGNOSIS — E782 Mixed hyperlipidemia: Secondary | ICD-10-CM | POA: Insufficient documentation

## 2011-04-03 DIAGNOSIS — Z853 Personal history of malignant neoplasm of breast: Secondary | ICD-10-CM | POA: Insufficient documentation

## 2011-04-03 DIAGNOSIS — I10 Essential (primary) hypertension: Secondary | ICD-10-CM | POA: Insufficient documentation

## 2011-04-03 DIAGNOSIS — E039 Hypothyroidism, unspecified: Secondary | ICD-10-CM | POA: Insufficient documentation

## 2011-04-03 DIAGNOSIS — C8190 Hodgkin lymphoma, unspecified, unspecified site: Secondary | ICD-10-CM

## 2011-04-03 LAB — COMPREHENSIVE METABOLIC PANEL
ALT: 29 U/L (ref 0–35)
Calcium: 9.5 mg/dL (ref 8.4–10.5)
Creatinine, Ser: 0.57 mg/dL (ref 0.4–1.2)
GFR calc Af Amer: >60 mL/min (ref >60)
Glucose, Bld: 87 mg/dL (ref 70–99)
Sodium: 138 mEq/L (ref 135–145)
Total Protein: 8.3 g/dL (ref 6.0–8.3)

## 2011-04-03 LAB — CBC
MCH: 30.4 pg (ref 26.0–34.0)
MCV: 91.1 fL (ref 78.0–100.0)
Platelets: 486 10*3/uL — ABNORMAL HIGH (ref 150–400)
RBC: 4.51 MIL/uL (ref 3.87–5.11)
RDW: 13.4 % (ref 11.5–15.5)

## 2011-04-03 LAB — DIFFERENTIAL
Eosinophils Absolute: 0 10*3/uL (ref 0.0–0.7)
Eosinophils Relative: 0 % (ref 0–5)
Lymphs Abs: 3.5 10*3/uL (ref 0.7–4.0)
Monocytes Absolute: 1.4 10*3/uL — ABNORMAL HIGH (ref 0.1–1.0)

## 2011-04-04 LAB — TSH: TSH: 0.883 u[IU]/mL (ref 0.350–4.500)

## 2011-04-04 LAB — LIPID PANEL
Cholesterol: 212 mg/dL — ABNORMAL HIGH (ref 0–200)
HDL: 63 mg/dL (ref >39)
Total CHOL/HDL Ratio: 3.4 RATIO
Triglycerides: 181 mg/dL — ABNORMAL HIGH (ref <150)

## 2011-08-02 LAB — BASIC METABOLIC PANEL
BUN: 10
Calcium: 9
Creatinine, Ser: 0.69
GFR calc Af Amer: >60

## 2011-08-02 LAB — CBC
Platelets: 573 — ABNORMAL HIGH
RBC: 4.24
WBC: 10.1

## 2011-08-02 LAB — PREGNANCY, URINE: Preg Test, Ur: NEGATIVE

## 2011-08-29 ENCOUNTER — Telehealth: Payer: Self-pay | Admitting: *Deleted

## 2011-08-29 MED ORDER — ROSUVASTATIN CALCIUM 20 MG PO TABS: 20.0000 mg | ORAL_TABLET | Freq: Every day | ORAL | Status: DC

## 2011-08-29 NOTE — Telephone Encounter (Signed)
I spoke with Brooke Gomez and she will start Crestor 20mg  at bedtime per Dr. Daleen Squibb recommendation. Brooke Gomez will follow-up with Wellness Screening. She will call back for 1 yr follow-up appt in January 2013 Mylo Red RN

## 2011-09-13 ENCOUNTER — Other Ambulatory Visit: Payer: Self-pay

## 2011-10-06 ENCOUNTER — Other Ambulatory Visit: Payer: Self-pay

## 2011-10-06 DIAGNOSIS — J069 Acute upper respiratory infection, unspecified: Secondary | ICD-10-CM

## 2011-10-06 MED ORDER — AZITHROMYCIN 250 MG PO TABS: ORAL_TABLET | ORAL | Status: AC

## 2011-10-27 ENCOUNTER — Encounter: Payer: Self-pay | Admitting: *Deleted

## 2011-10-30 ENCOUNTER — Encounter: Payer: Self-pay | Admitting: Cardiology

## 2011-10-30 ENCOUNTER — Ambulatory Visit (INDEPENDENT_AMBULATORY_CARE_PROVIDER_SITE_OTHER): Payer: 59 | Admitting: Cardiology

## 2011-10-30 VITALS — BP 124/86 | HR 94 | Ht 62.5 in | Wt 154.0 lb

## 2011-10-30 DIAGNOSIS — I10 Essential (primary) hypertension: Secondary | ICD-10-CM

## 2011-10-30 DIAGNOSIS — E785 Hyperlipidemia, unspecified: Secondary | ICD-10-CM

## 2011-10-30 NOTE — Progress Notes (Signed)
Addended by: Waymon Budge on: 10/30/2011 10:07 AM   Modules accepted: Orders

## 2011-10-30 NOTE — Progress Notes (Signed)
HPI Brooke Gomez comes in today for further evaluation and management of her history of noncardiac chest pain, mixed hyperlipidemia, and hypertension.  She's doing remarkably well with no complaints.  She is now being compliant with her Crestor. Her recent lipids showed a total cholesterol of 144, LDL 55, HDL 66 and triglycerides 116. Her AST was slightly elevated at 43 so she cut her Crestor from 20-10 mg. She is not have followup lab work since then.  She does not have a primary care physician.  Past Medical History  Diagnosis Date  . Unspecified essential hypertension   . Unspecified hypothyroidism   . Adenocarcinoma of breast     bilateral, hx  . Hodgkin's disease     personal history    Current Outpatient Prescriptions  Medication Sig Dispense Refill  . amLODipine-benazepril (LOTREL) 5-20 MG per capsule Take 1 capsule by mouth daily.        Marland Kitchen aspirin 81 MG tablet Take 160 mg by mouth daily.        . Calcium Carbonate POWD by Does not apply route daily.        Marland Kitchen levothyroxine (SYNTHROID, LEVOTHROID) 125 MCG tablet Take 125 mcg by mouth daily.        . multivitamin (THERAGRAN) per tablet Take 1 tablet by mouth daily.        . Omega-3 Fatty Acids (FISH OIL) 1360 MG CAPS Take by mouth.        . rosuvastatin (CRESTOR) 20 MG tablet Take 10 mg by mouth at bedtime.        . valACYclovir (VALTREX) 500 MG tablet Take 500 mg by mouth as needed.       Marland Kitchen dexlansoprazole (DEXILANT) 60 MG capsule Take 60 mg by mouth daily.          Allergies  Allergen Reactions  . Chlorhexidine Gluconate   . Penicillins     No family history on file.  History   Social History  . Marital Status: Single    Spouse Name: N/A    Number of Children: N/A  . Years of Education: N/A   Occupational History  . PA     Cardiothoracic Surgery at [employer]   Social History Main Topics  . Smoking status: Never Smoker   . Smokeless tobacco: Not on file  . Alcohol Use: Yes     a glass of wine daily  .  Drug Use: No  . Sexually Active: Not on file   Other Topics Concern  . Not on file   Social History Narrative  . No narrative on file    ROS ALL NEGATIVE EXCEPT THOSE NOTED IN HPI  PE  General Appearance: well developed, well nourished in no acute distress HEENT: symmetrical face, PERRLA, good dentition  Neck: no JVD, thyromegaly, or adenopathy, trachea midline Chest: symmetric without deformity Cardiac: PMI non-displaced, RRR, normal S1, S2, no gallop or murmur Lung: clear to ausculation and percussion Vascular: all pulses full without bruits  Abdominal: nondistended, nontender, good bowel sounds, no HSM, no bruits Extremities: no cyanosis, clubbing or edema, no sign of DVT, no varicosities  Skin: normal color, no rashes Neuro: alert and oriented x 3, non-focal Pysch: normal affect  EKG Normal sinus rhythm, normal EKG BMET    Component Value Date/Time   NA 138 04/03/2011 1610   K 3.9 04/03/2011 1610   CL 100 04/03/2011 1610   CO2 27 04/03/2011 1610   GLUCOSE 87 04/03/2011 1610   BUN 14 04/03/2011 1610  CREATININE 0.57 04/03/2011 1610   CALCIUM 9.5 04/03/2011 1610   GFRNONAA >60 04/03/2011 1610   GFRAA >60 04/03/2011 1610    Lipid Panel     Component Value Date/Time   CHOL 212* 04/03/2011 1610   TRIG 181* 04/03/2011 1610   HDL 63 04/03/2011 1610   CHOLHDL 3.4 04/03/2011 1610   VLDL 36 04/03/2011 1610   LDLCALC 113* 04/03/2011 1610    CBC    Component Value Date/Time   WBC 10.2 04/03/2011 1610   RBC 4.51 04/03/2011 1610   HGB 13.7 04/03/2011 1610   HCT 41.1 04/03/2011 1610   PLT 486* 04/03/2011 1610   MCV 91.1 04/03/2011 1610   MCH 30.4 04/03/2011 1610   MCHC 33.3 04/03/2011 1610   RDW 13.4 04/03/2011 1610   LYMPHSABS 3.5 04/03/2011 1610   LYMPHSABS 3.5 04/03/2011 1610   MONOABS 1.4* 04/03/2011 1610   MONOABS 1.4* 04/03/2011 1610   EOSABS 0.0 04/03/2011 1610   EOSABS 0.0 04/03/2011 1610   BASOSABS 0.1 04/03/2011 1610   BASOSABS 0.1 04/03/2011 1610

## 2011-10-30 NOTE — Assessment & Plan Note (Signed)
Improved and at goal. No change in her program.

## 2011-10-30 NOTE — Patient Instructions (Signed)
Your physician recommends that you schedule a follow-up appointment with Dr. Rene Kocher for primary care.  His office number is [phone].  Follow up with Dr. Juanito Doom as needed.

## 2011-10-30 NOTE — Assessment & Plan Note (Signed)
Improved in that goal. I suspect on 10 mg per day she will also still have a LDL below 70. She will need followup LFTs and lipids on lower dose however. I arranged for Dr. Caryl Never to take her on as a primary care patient. I will see her back on a p.r.n. Basis.

## 2011-11-01 NOTE — Progress Notes (Signed)
Addended by: Waymon Budge on: 11/01/2011 07:46 AM   Modules accepted: Orders

## 2012-04-01 ENCOUNTER — Ambulatory Visit (HOSPITAL_COMMUNITY): Payer: 59 | Admitting: Oncology

## 2012-04-15 ENCOUNTER — Encounter (HOSPITAL_COMMUNITY): Payer: 59 | Attending: Oncology | Admitting: Oncology

## 2012-04-15 VITALS — BP 139/93 | HR 100 | Temp 98.3°F | Ht 62.5 in | Wt 142.0 lb

## 2012-04-15 DIAGNOSIS — E039 Hypothyroidism, unspecified: Secondary | ICD-10-CM

## 2012-04-15 DIAGNOSIS — E032 Hypothyroidism due to medicaments and other exogenous substances: Secondary | ICD-10-CM | POA: Insufficient documentation

## 2012-04-15 DIAGNOSIS — I10 Essential (primary) hypertension: Secondary | ICD-10-CM | POA: Insufficient documentation

## 2012-04-15 DIAGNOSIS — Z853 Personal history of malignant neoplasm of breast: Secondary | ICD-10-CM | POA: Insufficient documentation

## 2012-04-15 DIAGNOSIS — E785 Hyperlipidemia, unspecified: Secondary | ICD-10-CM | POA: Insufficient documentation

## 2012-04-15 DIAGNOSIS — Z8571 Personal history of Hodgkin lymphoma: Secondary | ICD-10-CM | POA: Insufficient documentation

## 2012-04-15 DIAGNOSIS — Z09 Encounter for follow-up examination after completed treatment for conditions other than malignant neoplasm: Secondary | ICD-10-CM | POA: Insufficient documentation

## 2012-04-15 LAB — COMPREHENSIVE METABOLIC PANEL
Alkaline Phosphatase: 88 U/L (ref 39–117)
BUN: 15 mg/dL (ref 6–23)
Chloride: 101 mEq/L (ref 96–112)
Creatinine, Ser: 0.6 mg/dL (ref 0.50–1.10)
GFR calc Af Amer: >90 mL/min (ref >90)
Glucose, Bld: 100 mg/dL — ABNORMAL HIGH (ref 70–99)
Potassium: 4.3 mEq/L (ref 3.5–5.1)
Total Bilirubin: 0.2 mg/dL — ABNORMAL LOW (ref 0.3–1.2)
Total Protein: 8.1 g/dL (ref 6.0–8.3)

## 2012-04-15 LAB — CBC
HCT: 39.4 % (ref 36.0–46.0)
Hemoglobin: 13.6 g/dL (ref 12.0–15.0)
MCH: 32.2 pg (ref 26.0–34.0)
MCHC: 34.5 g/dL (ref 30.0–36.0)
MCV: 93.4 fL (ref 78.0–100.0)
RBC: 4.22 MIL/uL (ref 3.87–5.11)

## 2012-04-15 LAB — DIFFERENTIAL
Basophils Relative: 2 % — ABNORMAL HIGH (ref 0–1)
Eosinophils Absolute: 0.1 10*3/uL (ref 0.0–0.7)
Lymphs Abs: 3 10*3/uL (ref 0.7–4.0)
Monocytes Absolute: 0.9 10*3/uL (ref 0.1–1.0)
Monocytes Relative: 11 % (ref 3–12)
Neutrophils Relative %: 49 % (ref 43–77)

## 2012-04-15 LAB — LIPID PANEL: LDL Cholesterol: 55 mg/dL (ref 0–99)

## 2012-04-15 LAB — LACTATE DEHYDROGENASE: LDH: 209 U/L (ref 94–250)

## 2012-04-15 NOTE — Progress Notes (Signed)
Problem #1 history of stage IV Hodgkin's disease in 1985 treated with chemotherapy consisting of ABVD x6 cycles followed by mantle irradiation completed as of November 1985.  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  Problem #4 hypothyroidism secondary to her therapy for her Hodgkin's disease.  Problem #5 bleomycin pneumonitis in 1985 with resolution.  Problem #6 hypertension on therapy  Problem #7 hyperlipidemia on the Crestor  Brooke Gomez is still working full-time, has a normal performance status and no new problems oncologic review of systems. She has lost some weight which is excellent. She actually gotten down to 134 pounds she is now 142 pounds but can get back to where she wants to be which is 130 -135. She has no fevers chills night sweats etc.  Her physical exam is stable with no lymphadenopathy. The skin over her implants is negative. Her lungs are clear. Her heart shows a regular rhythm and rate without murmur rub or gallop. Abdomen is soft nontender without organomegaly. She has no arm or leg edema. She looks great overall.  She had her pneumococcal vaccine and her Haemophilus influenza-type B. vaccine on 04/26/2010 and 05/16/2010 respectively. She never went to the health Department to get her meningococcal vaccine but I have encouraged her to do that.  She remains free of disease. We will check her thyroid studies and other lab work. Her gynecologist will check her TSH in 6 months and let us know what it is. We will see her in one year.

## 2012-04-15 NOTE — Patient Instructions (Signed)
[facility]  Discharge Instructions Brooke Gomez  [id number] Sep 24, 1971 Dr. Glenford Peers   RECOMMENDATIONS MADE BY THE CONSULTANT AND ANY TEST RESULTS WILL BE SENT TO YOUR REFERRING DOCTOR.   EXAM FINDINGS BY MD TODAY AND SIGNS AND SYMPTOMS TO REPORT TO CLINIC OR PRIMARY MD:   MEDICATIONS PRESCRIBED: continue same meds   INSTRUCTIONS GIVEN AND DISCUSSED: Vaccine needed meningcoccal--pt has rx  SPECIAL INSTRUCTIONS/FOLLOW-UP: Lab work Needed in 6 months---tsh and you should also have liver enzymes done since you are on crestor  Return to see [place] in 1 year. I acknowledge that I have been informed and understand all the instructions given to me and received a copy. I do not have any more questions at this time, but understand that I may call the [facility] at [facility] at [phone] during business hours should I have any further questions or need assistance in obtaining follow-up care.    __________________________________________  _____________  __________ Signature of Patient or Authorized Representative            Date                   Time    __________________________________________ Nurse's Signature

## 2012-04-15 NOTE — Progress Notes (Signed)
Brooke Gomez presented for labwork. Labs per MD order drawn via Peripheral Line 25 gauge needle inserted in rt arm.  Good blood return present. Procedure without incident.  Needle removed intact. Patient tolerated procedure well.

## 2012-04-16 LAB — TSH: TSH: 2.007 u[IU]/mL (ref 0.350–4.500)

## 2012-05-10 ENCOUNTER — Other Ambulatory Visit (HOSPITAL_COMMUNITY): Payer: Self-pay | Admitting: Oncology

## 2012-09-24 ENCOUNTER — Other Ambulatory Visit: Payer: Self-pay | Admitting: *Deleted

## 2012-09-24 MED ORDER — ROSUVASTATIN CALCIUM 20 MG PO TABS: 20.0000 mg | ORAL_TABLET | Freq: Every day | ORAL | Status: DC

## 2012-09-30 ENCOUNTER — Other Ambulatory Visit: Payer: Self-pay | Admitting: *Deleted

## 2012-09-30 MED ORDER — ROSUVASTATIN CALCIUM 20 MG PO TABS: 20.0000 mg | ORAL_TABLET | Freq: Every day | ORAL | Status: DC

## 2012-09-30 MED ORDER — AMLODIPINE BESY-BENAZEPRIL HCL 5-20 MG PO CAPS: 1.0000 | ORAL_CAPSULE | Freq: Every day | ORAL | Status: DC

## 2012-10-03 ENCOUNTER — Telehealth: Payer: Self-pay | Admitting: Cardiology

## 2012-10-03 MED ORDER — AMLODIPINE BESY-BENAZEPRIL HCL 5-20 MG PO CAPS: 1.0000 | ORAL_CAPSULE | Freq: Every day | ORAL | Status: DC

## 2012-10-03 MED ORDER — ROSUVASTATIN CALCIUM 20 MG PO TABS: 20.0000 mg | ORAL_TABLET | Freq: Every day | ORAL | Status: DC

## 2012-10-03 NOTE — Telephone Encounter (Signed)
New problem:   Lotrel  5-20 mg.    [facility] [phone]

## 2013-03-31 ENCOUNTER — Other Ambulatory Visit (HOSPITAL_COMMUNITY): Payer: 59

## 2013-04-15 ENCOUNTER — Other Ambulatory Visit (HOSPITAL_COMMUNITY): Payer: 59

## 2013-04-16 ENCOUNTER — Ambulatory Visit (HOSPITAL_COMMUNITY): Payer: 59 | Admitting: Oncology

## 2013-04-28 ENCOUNTER — Other Ambulatory Visit: Payer: Self-pay | Admitting: Oncology

## 2013-04-28 DIAGNOSIS — C50919 Malignant neoplasm of unspecified site of unspecified female breast: Secondary | ICD-10-CM

## 2013-04-29 ENCOUNTER — Telehealth: Payer: Self-pay | Admitting: *Deleted

## 2013-04-29 NOTE — Telephone Encounter (Signed)
Lm gv appt for lab on 05/13/13@ 11am, and ov for 05/20/13@9am . i also made the pt aware that i will mail a letter/avs as well...td

## 2013-04-29 NOTE — Telephone Encounter (Signed)
Pt called back stating that the dates i gv wa not good. @ her request gv a lab for 05/05/13@8am  and ov w/ [facility] on 05/19/13 @ 10am...td

## 2013-05-05 ENCOUNTER — Other Ambulatory Visit (HOSPITAL_BASED_OUTPATIENT_CLINIC_OR_DEPARTMENT_OTHER): Payer: 59 | Admitting: Lab

## 2013-05-05 ENCOUNTER — Ambulatory Visit (HOSPITAL_COMMUNITY): Payer: 59

## 2013-05-05 DIAGNOSIS — C50919 Malignant neoplasm of unspecified site of unspecified female breast: Secondary | ICD-10-CM

## 2013-05-05 LAB — COMPREHENSIVE METABOLIC PANEL (CC13)
AST: 44 U/L — ABNORMAL HIGH (ref 5–34)
Albumin: 4.3 g/dL (ref 3.5–5.0)
Alkaline Phosphatase: 93 U/L (ref 40–150)
BUN: 8.7 mg/dL (ref 7.0–26.0)
Calcium: 9.1 mg/dL (ref 8.4–10.4)
Chloride: 105 mEq/L (ref 98–109)
Glucose: 98 mg/dl (ref 70–140)
Potassium: 3.9 mEq/L (ref 3.5–5.1)
Sodium: 141 mEq/L (ref 136–145)
Total Protein: 7.7 g/dL (ref 6.4–8.3)

## 2013-05-05 LAB — CBC WITH DIFFERENTIAL/PLATELET
Basophils Absolute: 0.1 10*3/uL (ref 0.0–0.1)
Eosinophils Absolute: 0.1 10*3/uL (ref 0.0–0.5)
HGB: 13.7 g/dL (ref 11.6–15.9)
MCV: 92.5 fL (ref 79.5–101.0)
MONO%: 12.1 % (ref 0.0–14.0)
NEUT#: 5.3 10*3/uL (ref 1.5–6.5)
RBC: 4.29 10*6/uL (ref 3.70–5.45)
RDW: 13.5 % (ref 11.2–14.5)
WBC: 10.8 10*3/uL — ABNORMAL HIGH (ref 3.9–10.3)
lymph#: 3.9 10*3/uL — ABNORMAL HIGH (ref 0.9–3.3)

## 2013-05-13 ENCOUNTER — Other Ambulatory Visit: Payer: 59 | Admitting: Lab

## 2013-05-19 ENCOUNTER — Other Ambulatory Visit (HOSPITAL_COMMUNITY): Payer: Self-pay | Admitting: Oncology

## 2013-05-19 ENCOUNTER — Ambulatory Visit (HOSPITAL_BASED_OUTPATIENT_CLINIC_OR_DEPARTMENT_OTHER): Payer: 59 | Admitting: Family

## 2013-05-19 ENCOUNTER — Telehealth: Payer: Self-pay | Admitting: *Deleted

## 2013-05-19 ENCOUNTER — Encounter: Payer: Self-pay | Admitting: Family

## 2013-05-19 VITALS — BP 147/89 | HR 93 | Temp 98.5°F | Resp 20 | Ht 62.5 in | Wt 148.8 lb

## 2013-05-19 DIAGNOSIS — C439 Malignant melanoma of skin, unspecified: Secondary | ICD-10-CM

## 2013-05-19 DIAGNOSIS — Z8571 Personal history of Hodgkin lymphoma: Secondary | ICD-10-CM

## 2013-05-19 DIAGNOSIS — Z853 Personal history of malignant neoplasm of breast: Secondary | ICD-10-CM

## 2013-05-19 MED ORDER — VALACYCLOVIR HCL 500 MG PO TABS: 1000.0000 mg | ORAL_TABLET | Freq: Two times a day (BID) | ORAL | Status: DC

## 2013-05-19 NOTE — Patient Instructions (Addendum)
Please contact us at [phone] if you have any questions or concerns.  Please continue to do well and enjoy life!!!  Get plenty of rest, drink plenty of water, exercise daily (walking), eat a balanced diet.    

## 2013-05-19 NOTE — Progress Notes (Addendum)
[facility]  Telephone:([phone]) [phone] Fax:(336) [phone]  OFFICE PROGRESS NOTE/NEW PATIENT EVALUATION   ID: Brooke Gomez   DOB: [birth year]  MR#: [id number]  ZHY#:[id number]   PCP: Brooke Gomez, M.D. GYN: Candice Camp, M.D. DERM:  Nita Sells, M.D. GI: Griffith Citron, M.D.   HISTORY OF PRESENT ILLNESS: Brooke Gomez has a history of being diagnosed with stage IV Hodgkin's in 1985.  She was treated by Dr. Durward Fortes.  She a participant in the AVBD protocol and received mantle radiation completed in 08/1984 at [facility].  She had a right-sided thoracotomy with splenectomy in 1985 and a second left-sided thoracotomy with laparotomy in 1986.  She has a history of bleomycin pneumonitis and 1985 with resolution. In 03/1999 at the age of 42, she was diagnosed with right breast high-grade, comedo type DCIS.  She had 1.5 years of antiestrogen therapy with Tamoxifen, but it was discontinued due to patient intolerance. She underwent a right breast mastectomy with expander is placed that same year.  In 2002 she was diagnosed with left breast DCIS and underwent a left breast mastectomy with expander placement.  Approximately 2 months ago she was diagnosed with melanoma (skin on left lower extremity posterior hamstring/knee area) that was recently excised by Dr. Nita Sells.  She presents today to establish care with Dr. Darnelle Catalan.  Her subsequent history is as detailed below.   INTERVAL HISTORY: Dr. Darnelle Catalan and I saw Brooke Gomez today for followup of Hodgkin's lymphoma/bilateral DCIS/melanoma.  The patient was last seen by Dr. Mariel Sleet on 04/15/2012.  Since her last office visit with Dr. Mariel Sleet, the patient has been doing relatively well.  Her interval history is significant for suffering a dog bite a couple months ago- it was established that the  a dog was current on its vaccinations. She is establishing herself with Dr. Darrall Dears service today.   REVIEW OF SYSTEMS: A  10 point review of systems was completed and is negative except for history of developing lip cold sores when exposed to sun.  She denies any other symptomatology.   PAST MEDICAL HISTORY: Past Medical History  Diagnosis Date  . Unspecified essential hypertension   . Unspecified hypothyroidism   . Adenocarcinoma of breast     bilateral, hx  . Hodgkin's disease     personal history  . Hyperlipemia   . Schatzki's ring     PAST SURGICAL HISTORY: Past Surgical History  Procedure Laterality Date  . Tubal ligation    . Mastectomy      bilateral  . Appendectomy    . Novasure ablation    . Splenectomy    . Thoracotomy    . Thyroidectomy    . Several lymph node biopies    . Tonsillectomy    . Esophageal dilation      Multiple times for Schatzki's Ring    FAMILY HISTORY Family History  Problem Relation Age of Onset  . Hypertension Mother   . Cancer Mother 81    Breast cancer  . Hyperlipidemia Father   . Scleroderma Sister 30    Esophageal  . Prostate cancer Paternal Grandfather   . Breast cancer Paternal Aunt     2 paternal aunts had breast cancer in their 30s  . Colon cancer Paternal Grandfather     GYNECOLOGIC HISTORY: Gravida 0, para 0, age of menarche 42, history of birth control use from ages 42 through 39, history of Mirena IUD usage for 3 years, but discontinued due to  menstrual cramping and spotting, history of tubal ligation and hysterectomy in 03/2009 the patient retains one ovary.   SOCIAL HISTORY: [name] married her husband Casimiro Needle in 12/2011.  It is her first marriage and her husband's second marriage.  He has 3 children from a previous marriage - a 57 year old daughter currently attending [employer], a 27 year old daughter, and a 31 year old son.  Her stepson has possible bipolar disorder that is controlled with Risperdal.  Her husband works as a Chiropractor. 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  She attended [employer] and graduated from [employer] in 2002.  In her spare time she enjoys drinking wine, traveling, and spending time with family/friends.    ADVANCED DIRECTIVES: Not on file  HEALTH MAINTENANCE: History  Substance Use Topics  . Smoking status: Never Smoker   . Smokeless tobacco: Never Used  . Alcohol Use: 0.0 oz/week    7-10 Glasses of wine per week     Comment: a glass of wine daily    Colonoscopy: 02/2013 PAP: Not on file Bone density: Last bone density we have on file dated 03/24/2012 showed a T score of -1.7 (osteopenia). Lipid panel: 04/15/2012   Allergies  Allergen Reactions  . Chlorhexidine Gluconate   . Penicillins     Current Outpatient Prescriptions  Medication Sig Dispense Refill  . amLODipine-benazepril (LOTREL) 5-20 MG per capsule Take 1 capsule by mouth daily.  90 capsule  1  . aspirin 81 MG tablet Take 81 mg by mouth daily.       . Calcium Carbonate-Vitamin D (CALTRATE 600+D PO) Take 1 tablet by mouth 2 (two) times daily.      Marland Kitchen levothyroxine (SYNTHROID, LEVOTHROID) 125 MCG tablet TAKE 1 TABLET BY MOUTH ONCE DAILY  90 tablet  PRN  . Multiple Vitamins-Minerals (MULTIVITAMIN PO) Take 1 tablet by mouth daily.      . Omega-3 Fatty Acids (FISH OIL) 1200 MG CAPS Take 1 capsule by mouth 2 (two) times daily.      Marland Kitchen omeprazole (PRILOSEC) 40 MG capsule Take 1 capsule (40 mg total) by mouth daily.      . rosuvastatin (CRESTOR) 20 MG tablet Take 1 tablet (20 mg total) by mouth daily.  90 tablet  1  . valACYclovir (VALTREX) 500 MG tablet Take 2 tablets (1,000 mg total) by mouth 2 (two) times daily.  90 tablet  2   No current facility-administered medications for this visit.    OBJECTIVE: Filed Vitals:   05/19/13 0956  BP: 147/89  Pulse: 93  Temp: 98.5 F (36.9 C)  Resp: 20     Body mass index is 26.77 kg/(m^2).      ECOG FS: 0 - Asymptomatic  General appearance: Alert, cooperative, well  nourished, no apparent distress Head: Normocephalic, without obvious abnormality, atraumatic Eyes: Conjunctivae/corneas clear, PERRLA, EOMI Nose: Nares, septum and mucosa are normal, no drainage or sinus tenderness Neck: No adenopathy, supple, symmetrical, trachea midline, thyroid not enlarged, no tenderness Resp: Clear to auscultation bilaterally Cardio: Regular rate and rhythm, S1, S2 normal, no murmur, click, rub or gallop Breasts: Bilateral mastectomies with reconstruction GI: Soft, distended, non-tender, hypoactive bowel sounds, no organomegaly Skin: Tattoos, left posterior back area with recent dog bite wound-without signs of infection Extremities: Extremities normal, atraumatic, no cyanosis or edema Lymph nodes: Cervical, supraclavicular, and axillary nodes normal Neurologic: Grossly normal   LAB RESULTS: Lab Results  Component Value Date   WBC 10.8*  05/05/2013   NEUTROABS 5.3 05/05/2013   HGB 13.7 05/05/2013   HCT 39.6 05/05/2013   MCV 92.5 05/05/2013   PLT 408* 05/05/2013      Chemistry      Component Value Date/Time   NA 141 05/05/2013 0801   NA 139 04/15/2012 1015   K 3.9 05/05/2013 0801   K 4.3 04/15/2012 1015   CL 101 04/15/2012 1015   CO2 24 05/05/2013 0801   CO2 26 04/15/2012 1015   BUN 8.7 05/05/2013 0801   BUN 15 04/15/2012 1015   CREATININE 0.7 05/05/2013 0801   CREATININE 0.60 04/15/2012 1015      Component Value Date/Time   CALCIUM 9.1 05/05/2013 0801   CALCIUM 9.5 04/15/2012 1015   ALKPHOS 93 05/05/2013 0801   ALKPHOS 88 04/15/2012 1015   AST 44* 05/05/2013 0801   AST 34 04/15/2012 1015   ALT 59* 05/05/2013 0801   ALT 30 04/15/2012 1015   BILITOT 0.48 05/05/2013 0801   BILITOT 0.2* 04/15/2012 1015      No results found for this basename: LABCA2    Urinalysis No results found for this basename: colorurine,  appearanceur,  labspec,  phurine,  glucoseu,  hgbur,  bilirubinur,  ketonesur,  proteinur,  urobilinogen,  nitrite,  leukocytesur    STUDIES: No results  found.  ASSESSMENT: Mrs. Brooke Gomez is a 42 y[place]o. [place]:  1. Diagnosed with stage IV Hodgkin's in 1985.  She was treated by Dr. Durward Fortes.  She a participant in the AVBD protocol and received mantle radiation completed in 08/1984 at [facility].  She had a right-sided thoracotomy with splenectomy in 1985 and a second left-sided thoracotomy with laparotomy in 1986.  She has a history of bleomycin pneumonitis and 1985 with resolution.   2. In 03/1999 at the age of 2, she was diagnosed with right breast high-grade, comedo type DCIS.  She had 1.5 years of antiestrogen therapy with Tamoxifen, but it was discontinued due to patient intolerance. She underwent a right breast mastectomy with expander is placed that same year.  In 2002 she was diagnosed with left breast DCIS and underwent a left breast mastectomy with expander placement.    3.  Approximately 2 months ago (02/2013) she was diagnosed with melanoma (skin on left lower extremity posterior  hamstring/knee area) that was recently excised by Dr. Nita Sells.  PLAN: The patient was provided with a prescription for Valtrex 500 mg (take 1000 mg) by mouth twice a day for cold sores before or after sun exposure.  She states she received her Haemophilus influenza-type B vaccination in 04/2010, pneumococcal vaccination in 2013 and T. gap in 01/2011.  She has not received her meningococcal vaccine from the health department, but does have a prescription for the vaccine.  We plan to continue yearly chest x-rays and we will schedule her to have a two-view chest x-ray this year and next year before her next appointment.  We plan to see her again in 04/2014 (after her annual chest x-ray), and we will check laboratories of CBC, CMP, LDH and vitamin D level at that time.  All questions were answered.  The patient was encouraged to contact us in the interim with any problems, questions or concerns.   Larina Bras,  NP-C 05/19/2013, 5:57 PM  ADDENDUM: Brooke Gomez established herself in my practice today. In brief:  (1) she had stage IV Hodgkin's lymphoma diagnosed in 1985, treated with ABVD, mantle radiation, and splenectomy.   (a) she  receives the triple pneumonia vaccine every 10 years,  (b) we will obtain a chest x-ray yearly as she is at risk for developing lung primaries in the radiation field. She understands we do not have data that this form of screening decreases mortality  (2) she has a history of bilateral noninvasive breast cancer, status post right mastectomy in 2000 and left mastectomy in 2002. Both are almost certainly cured  (3) on 03/11/2013 she underwent removal of an early melanoma in situ arising in a dysplastic nevus from her left posterior F. (DAA [id number]). Reexcision 03/18/2013 showed no residual melanoma ([id number] [id number]).  (4) a basal cell carcinoma was removed from her left breast 03/15/2013  We reviewed her diagnoses, treatment history, and continuing preventive followup. We are going to see her on a yearly basis with a chest x-ray, and will make sure her vaccines against encapsulated organisms are up to date. She knows to call immediately if she develops a temperature greater than 100, as being splenectomized she is at risk for fulminant infections.  I personally saw this patient and performed a substantive portion of this encounter with the listed APP documented above.   Lowella Dell, MD

## 2013-05-19 NOTE — Telephone Encounter (Signed)
sw pt gv appt for 05/19/14 labs@ 11 and ov@11 :30am. i also made the pt aware that she needs to have a cxr btween now and her next ov. i also made the pt aware that i will mail a letter/avs as well...td

## 2013-05-20 ENCOUNTER — Ambulatory Visit: Payer: 59 | Admitting: Family

## 2013-05-21 ENCOUNTER — Other Ambulatory Visit (HOSPITAL_COMMUNITY): Payer: Self-pay | Admitting: Oncology

## 2013-05-27 ENCOUNTER — Other Ambulatory Visit: Payer: Self-pay | Admitting: Family

## 2013-05-29 ENCOUNTER — Telehealth: Payer: Self-pay | Admitting: *Deleted

## 2013-05-29 NOTE — Telephone Encounter (Signed)
LMOVM Ok for pcp to recommend change from Crestor to another affordable statin. Dr. Daleen Squibb out of the office at this time. Pt is also overdue with pcp for lipid/lfts.

## 2013-05-29 NOTE — Telephone Encounter (Signed)
Last lipids/lfts with pcp 03/2012 Mylo Red RN

## 2013-06-02 ENCOUNTER — Telehealth: Payer: Self-pay | Admitting: Family

## 2013-06-02 ENCOUNTER — Ambulatory Visit (HOSPITAL_COMMUNITY)
Admission: RE | Admit: 2013-06-02 | Discharge: 2013-06-02 | Disposition: A | Discharge status: Home / Self Care | Payer: 59 | Source: Ambulatory Visit | Attending: Family | Admitting: Family

## 2013-06-02 DIAGNOSIS — Z853 Personal history of malignant neoplasm of breast: Secondary | ICD-10-CM | POA: Insufficient documentation

## 2013-06-02 DIAGNOSIS — Z8571 Personal history of Hodgkin lymphoma: Secondary | ICD-10-CM | POA: Insufficient documentation

## 2013-06-02 NOTE — Telephone Encounter (Signed)
Reviewed recent chest x-ray results (no active disease of the chest-postsurgical changes noted)  with Brooke Gomez -she voiced understanding.

## 2013-06-11 ENCOUNTER — Telehealth: Payer: Self-pay | Admitting: *Deleted

## 2013-06-11 DIAGNOSIS — E785 Hyperlipidemia, unspecified: Secondary | ICD-10-CM

## 2013-06-11 MED ORDER — ATORVASTATIN CALCIUM 20 MG PO TABS: 20.0000 mg | ORAL_TABLET | Freq: Every day | ORAL | Status: DC

## 2013-06-11 NOTE — Telephone Encounter (Signed)
Pt needs lab date in 6 weeks/ orders placed/ msg left for pt to call back.

## 2013-08-28 ENCOUNTER — Other Ambulatory Visit: Payer: Self-pay

## 2013-11-19 ENCOUNTER — Other Ambulatory Visit (HOSPITAL_COMMUNITY): Payer: Self-pay | Admitting: Oncology

## 2013-11-19 ENCOUNTER — Telehealth: Payer: Self-pay

## 2013-11-19 NOTE — Telephone Encounter (Signed)
Received request for synthroid refill. Dr Jana Hakim not monitoring synthroid. S/w technician at [facility] outpatient pharmacy and had her send request to Dr Ardeth Perfect. Tried to call pt with this information and no answer.

## 2014-05-15 ENCOUNTER — Telehealth: Payer: Self-pay | Admitting: *Deleted

## 2014-05-15 ENCOUNTER — Telehealth: Payer: Self-pay | Admitting: Oncology

## 2014-05-15 NOTE — Telephone Encounter (Signed)
LEFT  VOICE MAIL   FOR  PT TO CALL BACK AND  MAKE  AN  APPT  WITH DR  Johnsie Cancel  NEXT  AVAILABLE  .Adonis Housekeeper

## 2014-05-15 NOTE — Telephone Encounter (Signed)
Pt cld to CX appt-stated unaware and could not make appt-will call back to r/s

## 2014-05-19 ENCOUNTER — Other Ambulatory Visit: Payer: 59

## 2014-05-19 ENCOUNTER — Ambulatory Visit: Payer: 59 | Admitting: Oncology

## 2014-05-25 NOTE — Telephone Encounter (Signed)
APPT MADE FOR   09-01-14  WITH  DR Johnsie Cancel  AT  3:00PM./CY

## 2014-08-31 NOTE — Progress Notes (Signed)
Patient ID: Brooke Gomez, female   DOB: October 19, 1971, 43 y.o.   MRN: [id number]    43 yo previously seen by Dr Verl Blalock  His notes indicate history of non cardiac chest pain.  CRF;s HTN and elevated lipids on statin RX  She sees Dr Vernard Gambles as her  Primary and has hypothyroidism on replacement RX  Reviewed labs from August and TSH/LFTls normal and LDL 80.  She is concerned about long term risk Of vascular /valvular disease  She has some left foot plantar fasciatus that limits activity.  No dyspnea, palpitations or pain.  Previous echo's non diagnostic Due to breast implants.  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  She works as a vascular PA at [employer]: Denies fever, malais, weight loss, blurry vision, decreased visual acuity, cough, sputum, SOB, hemoptysis, pleuritic pain, palpitaitons, heartburn, abdominal pain, melena, lower extremity edema, claudication, or rash.  All other systems reviewed and negative   General: Affect appropriate Healthy:  appears stated age HEENT:left infraclavicular scar and thyroidectomy scar  Neck supple with no adenopathy JVP normal no bruits no thyromegaly Lungs clear with no wheezing and good diaphragmatic motion Heart:  S1/S2 3/6 aortic outflow tract  murmur,rub, gallop or click left thoracotomy scar  PMI normal Abdomen: benighn, BS positve, no tenderness, no AAA no bruit.  No HSM or HJR Distal pulses intact with no bruits No edema Neuro non-focal Skin warm and dry No muscular weakness  Medications Current Outpatient Prescriptions  Medication Sig Dispense Refill  . amLODipine-benazepril (LOTREL) 5-20 MG per capsule Take 1 capsule by mouth daily. 90 capsule 1  . aspirin 81 MG tablet Take 81 mg by mouth daily.     Brooke Gomez Kitchen atorvastatin (LIPITOR) 40 MG tablet   6  . Calcium Carbonate-Vitamin D (CALTRATE 600+D PO) Take 1 tablet by mouth 2 (two) times daily.    Brooke Gomez Kitchen levothyroxine (SYNTHROID, LEVOTHROID) 125  MCG tablet TAKE 1 TABLET BY MOUTH ONCE DAILY 90 tablet 1  . Multiple Vitamins-Minerals (MULTIVITAMIN PO) Take 1 tablet by mouth daily.    . Omega-3 Fatty Acids (FISH OIL) 1200 MG CAPS Take 1 capsule by mouth 2 (two) times daily.    Brooke Gomez Kitchen omeprazole (PRILOSEC) 40 MG capsule Take 1 capsule (40 mg total) by mouth daily.    . valACYclovir (VALTREX) 500 MG tablet Take 2 tablets (1,000 mg total) by mouth 2 (two) times daily. 90 tablet 2  . zolpidem (AMBIEN) 10 MG tablet   3   No current facility-administered medications for this visit.    Allergies Chlorhexidine gluconate and Penicillins  Family History: Family History  Problem Relation Age of Onset  . Hypertension Mother   . Cancer Mother 70    Breast cancer  . Hyperlipidemia Father   . Scleroderma Sister 30    Esophageal  . Prostate cancer Paternal Grandfather   . Breast cancer Paternal Aunt     2 paternal aunts had breast cancer in their 20s  . Colon cancer Paternal Grandfather     Social History: History   Social History  . Marital Status: Single    Spouse Name: N/A    Number of Children: N/A  . Years of Education: N/A   Occupational History  . PA     Cardiothoracic Surgery at [employer] Topics  . Smoking status: Never Smoker   . Smokeless tobacco: Never Used  . Alcohol Use: 0.0 oz/week  7-10 Glasses of wine per week     Comment: a glass of wine daily  . Drug Use: No  . Sexual Activity: Yes    Birth Control/ Protection: Surgical   Other Topics Concern  . Not on file   Social History Narrative    Past Surgical History  Procedure Laterality Date  . Tubal ligation    . Mastectomy      bilateral  . Appendectomy    . Novasure ablation    . Splenectomy    . Thoracotomy    . Thyroidectomy    . Several lymph node biopies    . Tonsillectomy    . Esophageal dilation      Multiple times for Schatzki's Ring    Past Medical History  Diagnosis Date  . Unspecified essential hypertension   .  Unspecified hypothyroidism   . Adenocarcinoma of breast     bilateral, hx  . Hodgkin's disease     personal history  . Hyperlipemia   . Schatzki's ring     Electrocardiogram:  10/30/11 SR rate 91  Normal ECG  Today NSR rate 91 LAE   Assessment and Plan

## 2014-09-01 ENCOUNTER — Ambulatory Visit (INDEPENDENT_AMBULATORY_CARE_PROVIDER_SITE_OTHER): Payer: 59 | Admitting: Cardiovascular Disease

## 2014-09-01 ENCOUNTER — Encounter: Payer: Self-pay | Admitting: Cardiovascular Disease

## 2014-09-01 ENCOUNTER — Other Ambulatory Visit: Payer: Self-pay | Admitting: Obstetrics and Gynecology

## 2014-09-01 ENCOUNTER — Ambulatory Visit (INDEPENDENT_AMBULATORY_CARE_PROVIDER_SITE_OTHER)
Admission: RE | Admit: 2014-09-01 | Discharge: 2014-09-01 | Disposition: A | Discharge status: Home / Self Care | Payer: 59 | Source: Ambulatory Visit | Attending: Cardiovascular Disease | Admitting: Cardiovascular Disease

## 2014-09-01 VITALS — BP 122/82 | HR 91 | Ht 62.5 in | Wt 147.1 lb

## 2014-09-01 DIAGNOSIS — R079 Chest pain, unspecified: Secondary | ICD-10-CM

## 2014-09-01 DIAGNOSIS — W908XXA Exposure to other nonionizing radiation, initial encounter: Secondary | ICD-10-CM

## 2014-09-01 DIAGNOSIS — IMO0002 Reserved for concepts with insufficient information to code with codable children: Secondary | ICD-10-CM

## 2014-09-01 DIAGNOSIS — E785 Hyperlipidemia, unspecified: Secondary | ICD-10-CM

## 2014-09-01 DIAGNOSIS — R011 Cardiac murmur, unspecified: Secondary | ICD-10-CM

## 2014-09-01 DIAGNOSIS — E890 Postprocedural hypothyroidism: Secondary | ICD-10-CM

## 2014-09-01 DIAGNOSIS — IMO0001 Reserved for inherently not codable concepts without codable children: Secondary | ICD-10-CM

## 2014-09-01 NOTE — Assessment & Plan Note (Signed)
TSH normal continue replacement

## 2014-09-01 NOTE — Assessment & Plan Note (Signed)
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

## 2014-09-01 NOTE — Assessment & Plan Note (Signed)
Cholesterol is at goal.  Continue current dose of statin and diet Rx.  No myalgias or side effects.  F/U  LFT's in 6 months. Lab Results  Component Value Date   LDLCALC 55 04/15/2012

## 2014-09-01 NOTE — Assessment & Plan Note (Signed)
Coronary calcium score to assess MI risk

## 2014-09-01 NOTE — Assessment & Plan Note (Signed)
Well controlled.  Continue current medications and low sodium Dash type diet.    

## 2014-09-01 NOTE — Patient Instructions (Addendum)
Your physician wants you to follow-up in:  [place] will receive a reminder letter in the mail two months in advance. If you don't receive a letter, please call our office to schedule the follow-up appointment.  Your physician recommends that you continue on your current medications as directed. Please refer to the Current Medication list given to you today. Your physician has requested that you have a cardiac MRI. Cardiac MRI uses a computer to create images of your heart as its beating, producing both still and moving pictures of your heart and major blood vessels. For further information please visit [email or web address] Please follow the instruction sheet given to you today for more information.  HAVE  DONE  ON   09-10-14 IF  POSSIBLE   CALCIUM  SCORE   TODAY    OUT OF  POCKET   $150.00

## 2014-09-02 LAB — CYTOLOGY - PAP

## 2014-09-04 ENCOUNTER — Other Ambulatory Visit: Payer: Self-pay | Admitting: *Deleted

## 2014-09-07 ENCOUNTER — Encounter: Payer: Self-pay | Admitting: Cardiovascular Disease

## 2014-09-08 ENCOUNTER — Encounter: Payer: Self-pay | Admitting: Cardiovascular Disease

## 2014-09-23 ENCOUNTER — Ambulatory Visit (HOSPITAL_COMMUNITY): Payer: 59

## 2014-10-02 ENCOUNTER — Ambulatory Visit (HOSPITAL_COMMUNITY)
Admission: RE | Admit: 2014-10-02 | Discharge: 2014-10-02 | Disposition: A | Discharge status: Home / Self Care | Payer: 59 | Source: Ambulatory Visit | Attending: Cardiovascular Disease | Admitting: Cardiovascular Disease

## 2014-10-02 DIAGNOSIS — R011 Cardiac murmur, unspecified: Secondary | ICD-10-CM | POA: Insufficient documentation

## 2015-09-29 ENCOUNTER — Encounter: Payer: Self-pay | Admitting: Cardiovascular Disease

## 2015-09-29 ENCOUNTER — Telehealth: Payer: Self-pay | Admitting: Cardiovascular Disease

## 2015-09-29 NOTE — Telephone Encounter (Signed)
New Message  This message is to inform you that we have made 3 consecutive attempts to contact the patient since 08/18/2015. We have also mailed a letter to the patient to inform them to call in and schedule their follow up appointment. Although we were unsuccessful in these attempts we wanted you to be aware of our efforts. Will remove the patient from our recall list at this time  Thanks,  Jarold Motto [facility]

## 2015-10-01 NOTE — Telephone Encounter (Signed)
NONE ./CY

## 2015-10-01 NOTE — Telephone Encounter (Signed)
TYPO   NOTED .Adonis Housekeeper

## 2015-11-24 DIAGNOSIS — Z1283 Encounter for screening for malignant neoplasm of skin: Secondary | ICD-10-CM | POA: Diagnosis not present

## 2015-11-24 DIAGNOSIS — D2271 Melanocytic nevi of right lower limb, including hip: Secondary | ICD-10-CM | POA: Diagnosis not present

## 2015-11-24 DIAGNOSIS — D2262 Melanocytic nevi of left upper limb, including shoulder: Secondary | ICD-10-CM | POA: Diagnosis not present

## 2015-11-24 DIAGNOSIS — Z85820 Personal history of malignant melanoma of skin: Secondary | ICD-10-CM | POA: Diagnosis not present

## 2015-11-24 DIAGNOSIS — D485 Neoplasm of uncertain behavior of skin: Secondary | ICD-10-CM | POA: Diagnosis not present

## 2015-11-24 DIAGNOSIS — B009 Herpesviral infection, unspecified: Secondary | ICD-10-CM | POA: Diagnosis not present

## 2015-11-24 DIAGNOSIS — Z08 Encounter for follow-up examination after completed treatment for malignant neoplasm: Secondary | ICD-10-CM | POA: Diagnosis not present

## 2015-12-08 NOTE — Progress Notes (Signed)
Patient ID: Brooke Gomez, female   DOB: [birth year], 45 y.o.   MRN: [id number]    45 y.o.  previously seen by Dr Verl Blalock  His notes indicate history of non cardiac chest pain.  CRF;s HTN and elevated lipids on statin RX  She sees Dr Vernard Gambles as her  Primary and has hypothyroidism on replacement RX  Reviewed labs from August and TSH/LFTls normal and LDL 80.  She is concerned about long term risk Of vascular /valvular disease  She has some left foot plantar fasciatus that limits activity.  No dyspnea, palpitations or pain.  Previous echo's non diagnostic Due to breast implants.  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  EF normal Echo not diagnostic due to breast implants   Labs reviewed from [place] .7 K 4.6 normal LFTls  Hct 40.3 Plt 429  LDL 79 TC 160   TSH 1.8    Has had melanoma and had multiple punch biopsies with Dr Nevada Crane recently ok   ROS: Denies fever, malais, weight loss, blurry vision, decreased visual acuity, cough, sputum, SOB, hemoptysis, pleuritic pain, palpitaitons, heartburn, abdominal pain, melena, lower extremity edema, claudication, or rash.  All other systems reviewed and negative   General: Affect appropriate Healthy:  appears stated age HEENT:left infraclavicular scar and thyroidectomy scar  Neck supple with no adenopathy JVP normal no bruits no thyromegaly Lungs clear with no wheezing and good diaphragmatic motion Heart:  S1/S2 3/6 aortic outflow tract  murmur,rub, gallop or click left thoracotomy scar  PMI normal Abdomen: benighn, BS positve, no tenderness, no AAA no bruit.  No HSM or HJR Distal pulses intact with no bruits No edema Neuro non-focal Skin punch biopsies healing legs and left arm  No muscular weakness  Medications Current Outpatient Prescriptions  Medication  Sig Dispense Refill  . amLODipine (NORVASC) 5 MG tablet Take 5 mg by mouth daily.  2  . aspirin 81 MG tablet Take 81 mg by mouth daily.     Marland Kitchen atorvastatin (LIPITOR) 40 MG tablet Take 40 mg by mouth daily at 6 PM.   6  . benazepril (LOTENSIN) 20 MG tablet Take 20 mg by mouth daily.  2  . Calcium Carbonate-Vitamin D (CALTRATE 600+D PO) Take 1 tablet by mouth 2 (two) times daily.    Marland Kitchen levothyroxine (SYNTHROID, LEVOTHROID) 125 MCG tablet TAKE 1 TABLET BY MOUTH ONCE DAILY 90 tablet 1  . Multiple Vitamins-Minerals (MULTIVITAMIN PO) Take 1 tablet by mouth daily.    . Omega-3 Fatty Acids (FISH OIL) 1200 MG CAPS Take 1 capsule by mouth 2 (two) times daily.    Marland Kitchen omeprazole (PRILOSEC) 20 MG capsule Take 20 mg by mouth 2 (two) times daily.  3  . valACYclovir (VALTREX) 500 MG tablet Take 2 tablets (1,000 mg total) by mouth 2 (two) times daily. 90 tablet 2  . zolpidem (AMBIEN) 10 MG tablet Take 5 mg by mouth at bedtime as needed for sleep.   3   No current facility-administered medications for this visit.    Allergies Penicillins and Chlorhexidine gluconate  Family History: Family History  Problem Relation Age of Onset  . Hypertension Mother   . Cancer Mother 46    Breast cancer  . Hyperlipidemia Father   . Scleroderma Sister 55  Esophageal  . Prostate cancer Paternal Grandfather   . Breast cancer Paternal Aunt     2 paternal aunts had breast cancer in their 48s  . Colon cancer Paternal Grandfather     Social History: Social History   Social History  . Marital Status: Single    Spouse Name: N/A  . Number of Children: N/A  . Years of Education: N/A   Occupational History  . 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 Smoking status: Never Smoker   . Smokeless tobacco: Never Used  . Alcohol Use: 0.0 oz/week    7-10 Glasses of wine per week     Comment: a glass of wine daily  . Drug Use: No  . Sexual Activity: Yes    Birth Control/ Protection:  Surgical   Other Topics Concern  . Not on file   Social History Narrative    Past Surgical History  Procedure Laterality Date  . Tubal ligation    . Mastectomy      bilateral  . Appendectomy    . Novasure ablation    . Splenectomy    . Thoracotomy    . Thyroidectomy    . Several lymph node biopies    . Tonsillectomy    . Esophageal dilation      Multiple times for Schatzki's Ring    Past Medical History  Diagnosis Date  . Unspecified essential hypertension   . Unspecified hypothyroidism   . 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 Hodgkin's disease     personal history  . Hyperlipemia   . Schatzki's ring     Electrocardiogram:  10/30/11 SR rate 91  Normal ECG   09/01/14   NSR rate 91 LAE   12/13/15  SR ate 69 normal   Assessment and Plan  Chest Pain relsoved normal ECG follow NHL  Rx with no recurrence HTN: Well controlled.  Continue current medications and low sodium Dash type diet.  On ACE Thyroid  Normal TSH f/u primary continue current replacement dose Chol  At goal continue statin LFTls normal AS:  Mild no change in murmur  Will need TEE if she has new symptoms or murmur becomes late peaking with absent S2   Jenkins Rouge

## 2015-12-13 ENCOUNTER — Encounter: Payer: Self-pay | Admitting: Cardiovascular Disease

## 2015-12-13 ENCOUNTER — Ambulatory Visit (INDEPENDENT_AMBULATORY_CARE_PROVIDER_SITE_OTHER): Payer: 59 | Admitting: Cardiovascular Disease

## 2015-12-13 VITALS — BP 140/84 | HR 74 | Ht 62.5 in | Wt 149.8 lb

## 2015-12-13 DIAGNOSIS — R011 Cardiac murmur, unspecified: Secondary | ICD-10-CM

## 2015-12-13 NOTE — Patient Instructions (Addendum)

## 2015-12-17 MED FILL — valACYclovir HCL 1 GM TABS: 1 | 90 days supply | Qty: 90 | Fill #0

## 2016-01-10 MED FILL — BENAZEPRIL HCL 20 MG TABLET: 20 | 90 days supply | Qty: 90 | Fill #1

## 2016-01-10 MED FILL — LEVOTHYROXINE 125 MCG TAB: 125 | 90 days supply | Qty: 90 | Fill #1

## 2016-01-10 MED FILL — AMLODIPINE BESYLATE 5 MG TA: 5 | 90 days supply | Qty: 90 | Fill #1

## 2016-01-10 MED FILL — ZOLPIDEM TARTRATE 10 MG TAB: 10 | 60 days supply | Qty: 30 | Fill #1

## 2016-03-06 MED FILL — ZOLPIDEM TARTRATE 10 MG TAB: 10 | 60 days supply | Qty: 30 | Fill #2

## 2016-03-06 MED FILL — ATORVASTATIN 40 MG TABLET: 40 | 90 days supply | Qty: 90 | Fill #1

## 2016-03-13 MED FILL — OMEPRAZOLE DR 20 MG CAPSULE: 20 | 45 days supply | Qty: 90 | Fill #0

## 2016-04-11 MED FILL — AMLODIPINE BESYLATE 5 MG TA: 5 | 90 days supply | Qty: 90 | Fill #2

## 2016-04-11 MED FILL — BENAZEPRIL HCL 20 MG TABLET: 20 | 90 days supply | Qty: 90 | Fill #2

## 2016-04-11 MED FILL — LEVOTHYROXINE 125 MCG TAB: 125 | 90 days supply | Qty: 90 | Fill #2

## 2016-06-09 ENCOUNTER — Encounter: Payer: Self-pay | Admitting: Internal Medicine

## 2016-06-09 DIAGNOSIS — Z0000 Encounter for general adult medical examination without abnormal findings: Secondary | ICD-10-CM | POA: Diagnosis not present

## 2016-06-09 DIAGNOSIS — K219 Gastro-esophageal reflux disease without esophagitis: Secondary | ICD-10-CM | POA: Diagnosis not present

## 2016-06-09 DIAGNOSIS — I10 Essential (primary) hypertension: Secondary | ICD-10-CM | POA: Diagnosis not present

## 2016-06-09 DIAGNOSIS — E7800 Pure hypercholesterolemia, unspecified: Secondary | ICD-10-CM | POA: Diagnosis not present

## 2016-06-09 MED FILL — DICLOFENAC SODIUM 1% GEL: 1 | 8 days supply | Qty: 100 | Fill #0

## 2016-06-20 MED FILL — OMEPRAZOLE DR 20 MG CAPSULE: 20 | 45 days supply | Qty: 90 | Fill #1

## 2016-07-11 DIAGNOSIS — E7800 Pure hypercholesterolemia, unspecified: Secondary | ICD-10-CM | POA: Diagnosis not present

## 2016-07-11 DIAGNOSIS — C8190 Hodgkin lymphoma, unspecified, unspecified site: Secondary | ICD-10-CM | POA: Diagnosis not present

## 2016-07-11 DIAGNOSIS — E039 Hypothyroidism, unspecified: Secondary | ICD-10-CM | POA: Diagnosis not present

## 2016-07-11 DIAGNOSIS — Z0000 Encounter for general adult medical examination without abnormal findings: Secondary | ICD-10-CM | POA: Diagnosis not present

## 2016-07-11 DIAGNOSIS — K219 Gastro-esophageal reflux disease without esophagitis: Secondary | ICD-10-CM | POA: Diagnosis not present

## 2016-07-11 DIAGNOSIS — I10 Essential (primary) hypertension: Secondary | ICD-10-CM | POA: Diagnosis not present

## 2016-07-13 MED FILL — LEVOTHYROXINE 112 MCG TAB: 112 | 90 days supply | Qty: 90 | Fill #0

## 2016-07-13 MED FILL — BENAZEPRIL HCL 20 MG TABLET: 20 | 90 days supply | Qty: 90 | Fill #0

## 2016-07-13 MED FILL — AMLODIPINE BESYLATE 5 MG TA: 5 | 90 days supply | Qty: 90 | Fill #0

## 2016-07-31 DIAGNOSIS — H5213 Myopia, bilateral: Secondary | ICD-10-CM | POA: Diagnosis not present

## 2016-07-31 DIAGNOSIS — H524 Presbyopia: Secondary | ICD-10-CM | POA: Diagnosis not present

## 2016-07-31 DIAGNOSIS — H35033 Hypertensive retinopathy, bilateral: Secondary | ICD-10-CM | POA: Diagnosis not present

## 2016-07-31 DIAGNOSIS — H40023 Open angle with borderline findings, high risk, bilateral: Secondary | ICD-10-CM | POA: Diagnosis not present

## 2016-08-10 MED FILL — ZOLPIDEM TARTRATE 10 MG TAB: 10 | 30 days supply | Qty: 30 | Fill #0

## 2016-08-17 DIAGNOSIS — H40023 Open angle with borderline findings, high risk, bilateral: Secondary | ICD-10-CM | POA: Diagnosis not present

## 2016-09-08 MED FILL — ATORVASTATIN 40 MG TABLET: 40 | 90 days supply | Qty: 90 | Fill #2

## 2016-09-19 MED FILL — OMEPRAZOLE DR 20 MG CAPSULE: 20 | 45 days supply | Qty: 90 | Fill #2

## 2016-10-12 MED FILL — BENAZEPRIL HCL 20 MG TABLET: 20 | 90 days supply | Qty: 90 | Fill #1

## 2016-10-12 MED FILL — AMLODIPINE BESYLATE 5 MG TA: 5 | 90 days supply | Qty: 90 | Fill #1

## 2016-10-12 MED FILL — LEVOTHYROXINE 112 MCG TAB: 112 | 90 days supply | Qty: 90 | Fill #1

## 2016-11-09 MED FILL — ZOLPIDEM TARTRATE 10 MG TAB: 10 | 30 days supply | Qty: 30 | Fill #1

## 2016-12-13 MED FILL — OMEPRAZOLE DR 20 MG CAPSULE: 20 | 30 days supply | Qty: 60 | Fill #0

## 2017-01-05 DIAGNOSIS — D2272 Melanocytic nevi of left lower limb, including hip: Secondary | ICD-10-CM | POA: Diagnosis not present

## 2017-01-05 DIAGNOSIS — Z1283 Encounter for screening for malignant neoplasm of skin: Secondary | ICD-10-CM | POA: Diagnosis not present

## 2017-01-05 DIAGNOSIS — D225 Melanocytic nevi of trunk: Secondary | ICD-10-CM | POA: Diagnosis not present

## 2017-01-05 DIAGNOSIS — Z08 Encounter for follow-up examination after completed treatment for malignant neoplasm: Secondary | ICD-10-CM | POA: Diagnosis not present

## 2017-01-05 DIAGNOSIS — I781 Nevus, non-neoplastic: Secondary | ICD-10-CM | POA: Diagnosis not present

## 2017-01-05 DIAGNOSIS — Z85820 Personal history of malignant melanoma of skin: Secondary | ICD-10-CM | POA: Diagnosis not present

## 2017-01-08 MED FILL — LEVOTHYROXINE 112 MCG TAB: 112 | 90 days supply | Qty: 90 | Fill #2

## 2017-01-08 MED FILL — AMLODIPINE BESYLATE 5 MG TA: 5 | 90 days supply | Qty: 90 | Fill #2

## 2017-01-08 MED FILL — BENAZEPRIL HCL 20 MG TABLET: 20 | 90 days supply | Qty: 90 | Fill #2

## 2017-01-08 MED FILL — ZOLPIDEM TARTRATE 10 MG TAB: 10 | 30 days supply | Qty: 30 | Fill #2

## 2017-02-20 MED FILL — OMEPRAZOLE DR 20 MG CAPSULE: 20 | 30 days supply | Qty: 60 | Fill #0

## 2017-02-22 MED FILL — ATORVASTATIN 40 MG TABLET: 40 | 90 days supply | Qty: 90 | Fill #0

## 2017-03-06 DIAGNOSIS — E039 Hypothyroidism, unspecified: Secondary | ICD-10-CM | POA: Diagnosis not present

## 2017-03-06 DIAGNOSIS — I10 Essential (primary) hypertension: Secondary | ICD-10-CM | POA: Diagnosis not present

## 2017-03-06 DIAGNOSIS — E7800 Pure hypercholesterolemia, unspecified: Secondary | ICD-10-CM | POA: Diagnosis not present

## 2017-03-09 DIAGNOSIS — E039 Hypothyroidism, unspecified: Secondary | ICD-10-CM | POA: Diagnosis not present

## 2017-03-09 DIAGNOSIS — I10 Essential (primary) hypertension: Secondary | ICD-10-CM | POA: Diagnosis not present

## 2017-03-09 DIAGNOSIS — K7689 Other specified diseases of liver: Secondary | ICD-10-CM | POA: Diagnosis not present

## 2017-03-09 DIAGNOSIS — K219 Gastro-esophageal reflux disease without esophagitis: Secondary | ICD-10-CM | POA: Diagnosis not present

## 2017-03-09 MED FILL — LEVOTHYROXINE 125 MCG TABLE: 125 | 30 days supply | Qty: 30 | Fill #0

## 2017-03-13 ENCOUNTER — Other Ambulatory Visit: Payer: Self-pay | Admitting: Internal Medicine

## 2017-03-13 DIAGNOSIS — R945 Abnormal results of liver function studies: Secondary | ICD-10-CM

## 2017-04-02 MED FILL — ZOLPIDEM TARTRATE 10 MG TAB: 10 | 30 days supply | Qty: 30 | Fill #0

## 2017-04-02 MED FILL — LEVOTHYROXINE 125 MCG TABLE: 125 | 90 days supply | Qty: 90 | Fill #1

## 2017-04-09 MED FILL — BENAZEPRIL HCL 20 MG TABLET: 20 | 90 days supply | Qty: 90 | Fill #3

## 2017-04-09 MED FILL — OMEPRAZOLE DR 20 MG CAPSULE: 20 | 30 days supply | Qty: 60 | Fill #1

## 2017-04-09 MED FILL — AMLODIPINE BESYLATE 5 MG TA: 5 | 90 days supply | Qty: 90 | Fill #3

## 2017-04-30 ENCOUNTER — Ambulatory Visit
Admission: RE | Admit: 2017-04-30 | Discharge: 2017-04-30 | Disposition: A | Discharge status: Home / Self Care | Payer: 59 | Source: Ambulatory Visit | Attending: Internal Medicine | Admitting: Internal Medicine

## 2017-04-30 DIAGNOSIS — R945 Abnormal results of liver function studies: Secondary | ICD-10-CM

## 2017-04-30 DIAGNOSIS — R7989 Other specified abnormal findings of blood chemistry: Secondary | ICD-10-CM | POA: Diagnosis not present

## 2017-05-07 MED FILL — valACYclovir HCL 1 GM TABS: 1 | 90 days supply | Qty: 90 | Fill #0

## 2017-06-18 MED FILL — OMEPRAZOLE 20 MG CAP: 20 | 30 days supply | Qty: 60 | Fill #2

## 2017-07-05 MED FILL — BENAZEPRIL HCL 20 MG TABLET: 20 | 90 days supply | Qty: 90 | Fill #0

## 2017-07-05 MED FILL — AMLODIPINE BESYLATE 5 MG TA: 5 | 90 days supply | Qty: 90 | Fill #0

## 2017-07-05 MED FILL — ATORVASTATIN 40 MG TABLET: 40 | 90 days supply | Qty: 90 | Fill #1

## 2017-07-05 MED FILL — LEVOTHYROXINE 125 MCG TABLE: 125 | 60 days supply | Qty: 60 | Fill #2

## 2017-07-05 MED FILL — ZOLPIDEM TARTRATE 10 MG TAB: 10 | 30 days supply | Qty: 30 | Fill #1

## 2017-07-10 IMAGING — US US ABDOMEN LIMITED
1 series · 14 of 25 positions shown · non-contrast
Comparison: None.

CLINICAL DATA: Abnormal LFTs

EXAM:
ULTRASOUND ABDOMEN LIMITED RIGHT UPPER QUADRANT

[Series 1: us abdomen limited · 0.14mm/px · 14 of 48 slices shown]
[im 1/48]
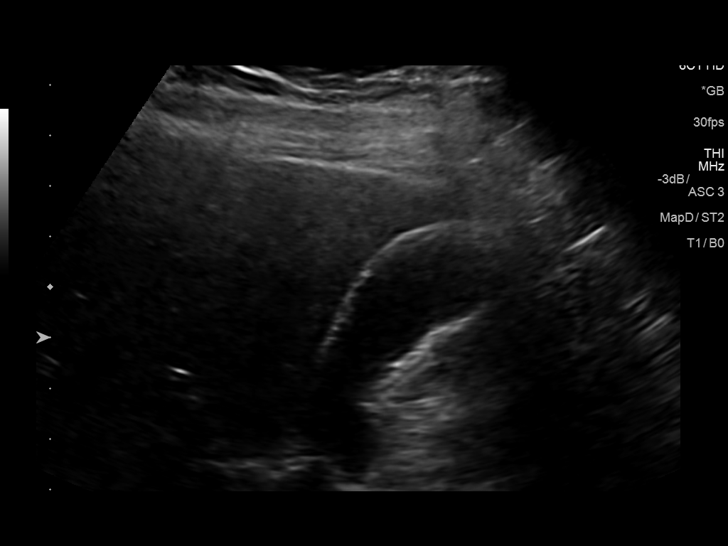
[im 4/48]
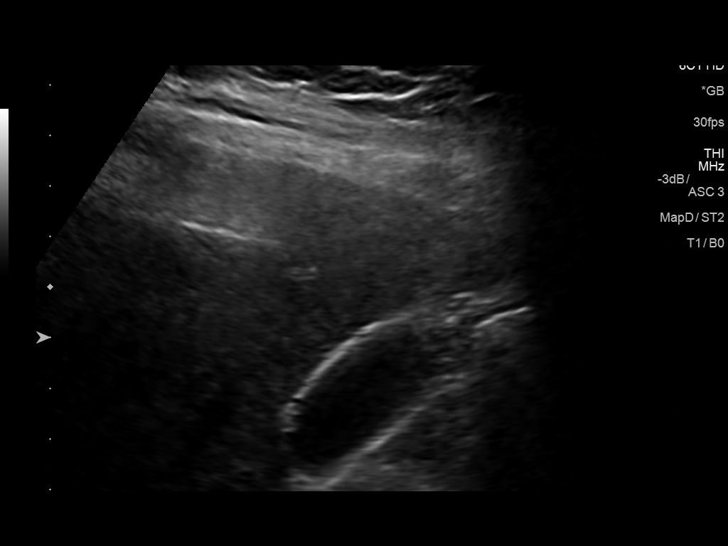
[im 8/48]
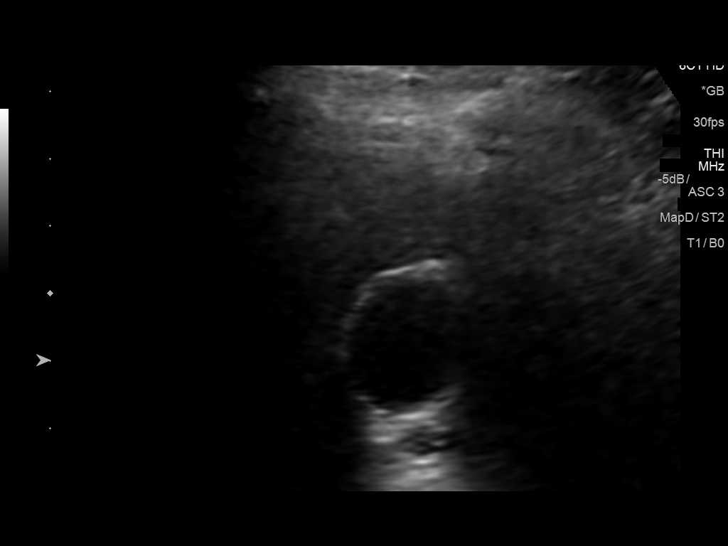
[im 12/48]
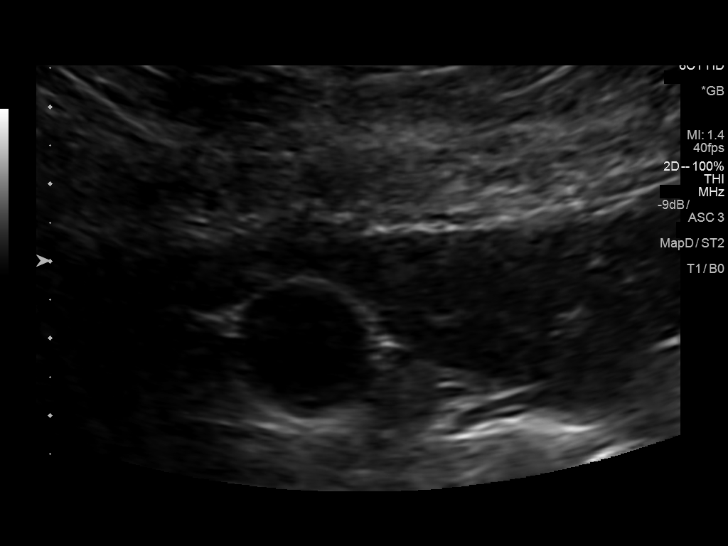
[im 16/48]
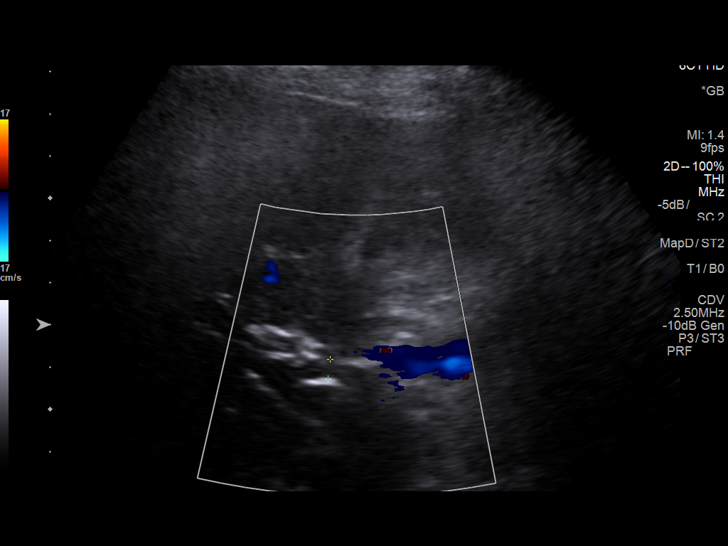
[im 18/48]
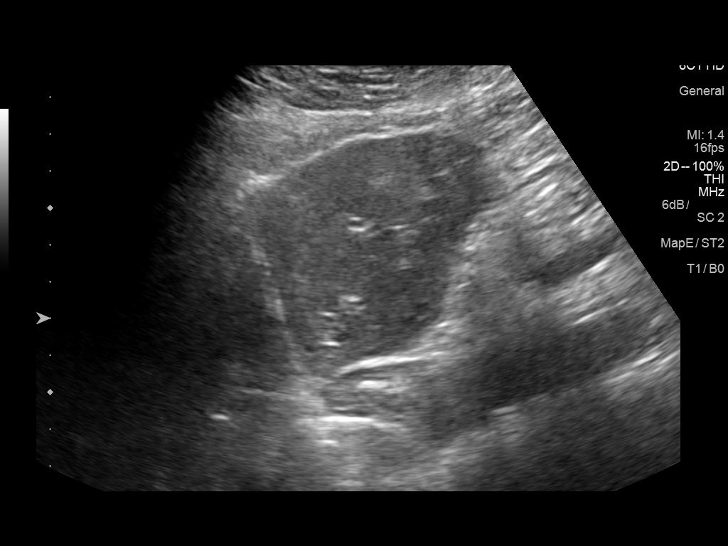
[im 22/48]
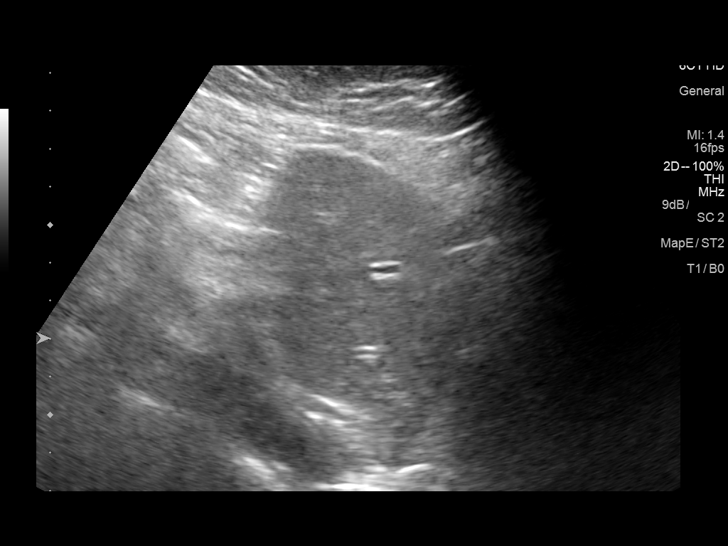
[im 26/48]
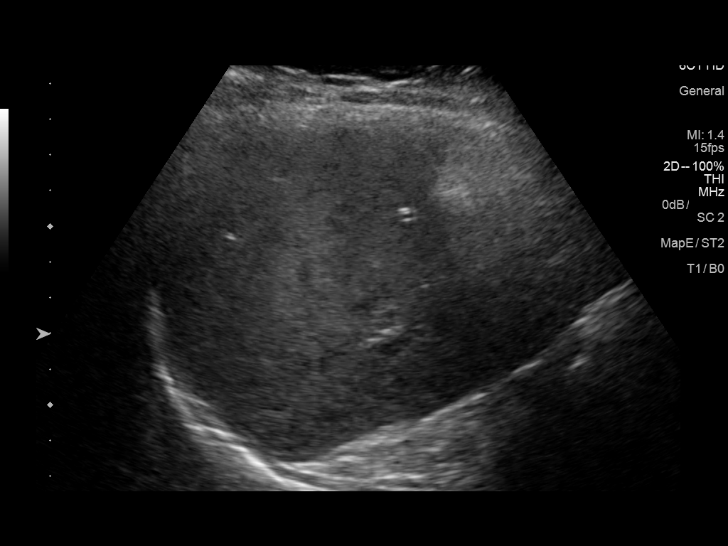
[im 30/48]
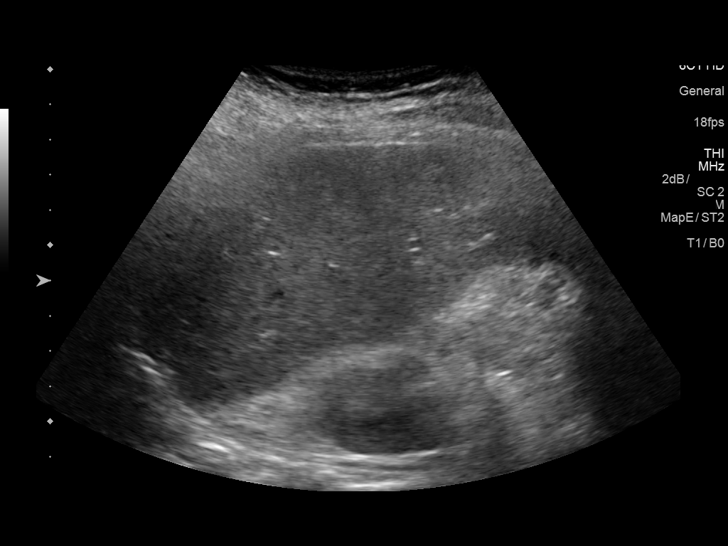
[im 32/48]
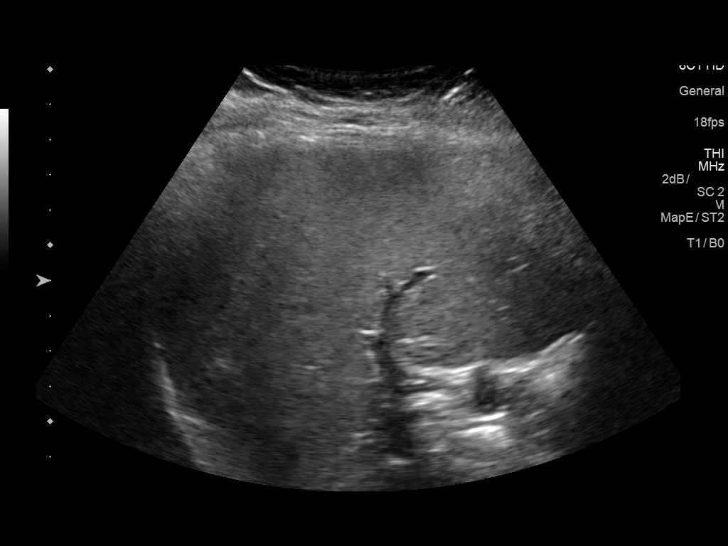
[im 36/48]
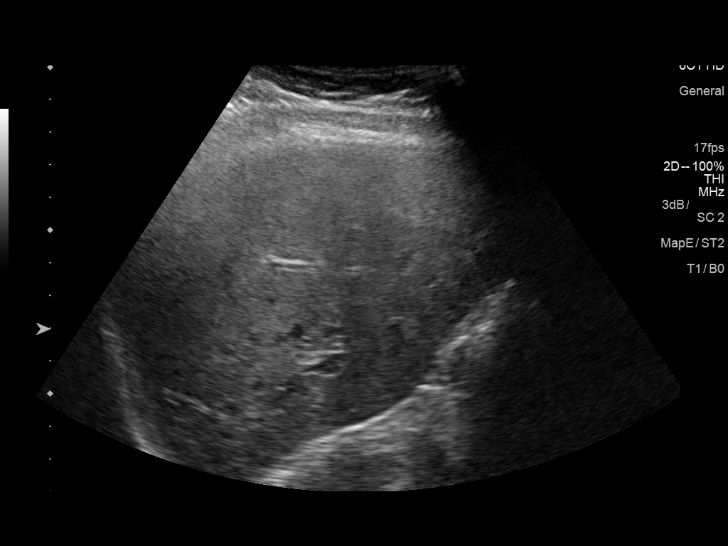
[im 40/48]
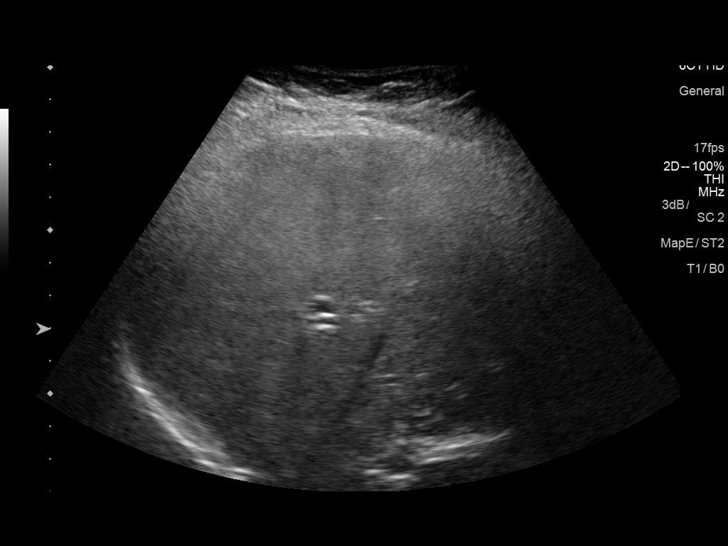
[im 44/48]
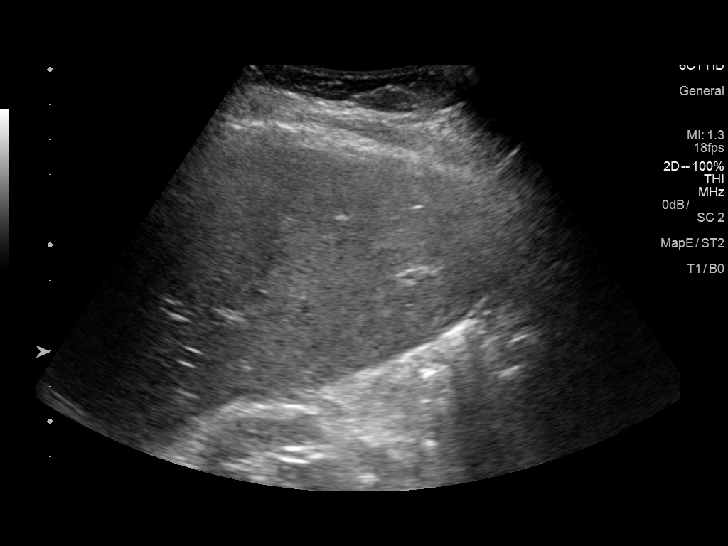
[im 48/48]
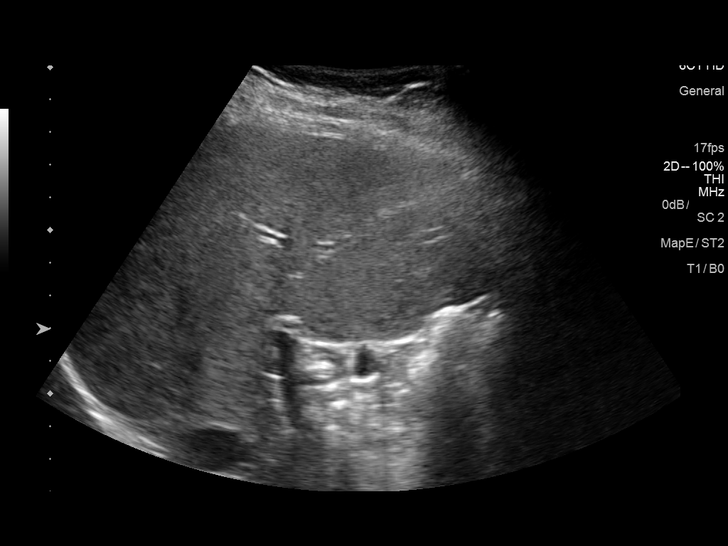

[14 of 25 positions shown; findings below may reference images not displayed]

FINDINGS: Gallbladder:

No gallstones or wall thickening visualized. No sonographic Murphy
sign noted by sonographer.

Common bile duct:

Diameter: Normal caliber, 4 mm

Liver:

No focal lesion identified. Within normal limits in parenchymal
echogenicity.
IMPRESSION: Normal right upper quadrant ultrasound.

## 2017-07-31 DIAGNOSIS — H40053 Ocular hypertension, bilateral: Secondary | ICD-10-CM | POA: Diagnosis not present

## 2017-07-31 DIAGNOSIS — H40023 Open angle with borderline findings, high risk, bilateral: Secondary | ICD-10-CM | POA: Diagnosis not present

## 2017-07-31 DIAGNOSIS — H5211 Myopia, right eye: Secondary | ICD-10-CM | POA: Diagnosis not present

## 2017-07-31 DIAGNOSIS — H524 Presbyopia: Secondary | ICD-10-CM | POA: Diagnosis not present

## 2017-07-31 DIAGNOSIS — H35033 Hypertensive retinopathy, bilateral: Secondary | ICD-10-CM | POA: Diagnosis not present

## 2017-07-31 DIAGNOSIS — H52221 Regular astigmatism, right eye: Secondary | ICD-10-CM | POA: Diagnosis not present

## 2017-08-08 MED FILL — OMEPRAZOLE 20 MG CAP: 20 | 90 days supply | Qty: 180 | Fill #0

## 2017-08-21 DIAGNOSIS — K7689 Other specified diseases of liver: Secondary | ICD-10-CM | POA: Diagnosis not present

## 2017-08-21 DIAGNOSIS — E039 Hypothyroidism, unspecified: Secondary | ICD-10-CM | POA: Diagnosis not present

## 2017-09-06 MED FILL — LEVOTHYROXINE 137 MCG TAB: 137 | 30 days supply | Qty: 30 | Fill #0

## 2017-09-24 DIAGNOSIS — G4700 Insomnia, unspecified: Secondary | ICD-10-CM | POA: Diagnosis not present

## 2017-09-24 DIAGNOSIS — E039 Hypothyroidism, unspecified: Secondary | ICD-10-CM | POA: Diagnosis not present

## 2017-09-24 DIAGNOSIS — R232 Flushing: Secondary | ICD-10-CM | POA: Diagnosis not present

## 2017-09-24 DIAGNOSIS — E7800 Pure hypercholesterolemia, unspecified: Secondary | ICD-10-CM | POA: Diagnosis not present

## 2017-10-02 MED FILL — BENAZEPRIL HCL 20 MG TABLET: 20 | 90 days supply | Qty: 90 | Fill #1

## 2017-10-02 MED FILL — SYNTHROID 137 MCG TABLET: 137 | 90 days supply | Qty: 90 | Fill #0

## 2017-10-02 MED FILL — AMLODIPINE BESYLATE 5 MG TA: 5 | 90 days supply | Qty: 90 | Fill #1

## 2017-10-17 DIAGNOSIS — C50919 Malignant neoplasm of unspecified site of unspecified female breast: Secondary | ICD-10-CM | POA: Insufficient documentation

## 2017-10-17 DIAGNOSIS — K222 Esophageal obstruction: Secondary | ICD-10-CM | POA: Insufficient documentation

## 2017-10-17 DIAGNOSIS — C439 Malignant melanoma of skin, unspecified: Secondary | ICD-10-CM | POA: Insufficient documentation

## 2017-10-17 DIAGNOSIS — E785 Hyperlipidemia, unspecified: Secondary | ICD-10-CM | POA: Insufficient documentation
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
## 2017-11-02 ENCOUNTER — Encounter: Payer: Self-pay | Admitting: Cardiovascular Disease

## 2017-11-02 ENCOUNTER — Ambulatory Visit: Payer: 59 | Admitting: Cardiovascular Disease

## 2017-11-02 VITALS — BP 100/80 | HR 100 | Ht 62.5 in | Wt 150.0 lb

## 2017-11-02 DIAGNOSIS — R011 Cardiac murmur, unspecified: Secondary | ICD-10-CM | POA: Diagnosis not present

## 2017-11-02 DIAGNOSIS — I350 Nonrheumatic aortic (valve) stenosis: Secondary | ICD-10-CM | POA: Diagnosis not present

## 2017-11-02 DIAGNOSIS — I10 Essential (primary) hypertension: Secondary | ICD-10-CM | POA: Diagnosis not present

## 2017-11-02 NOTE — Patient Instructions (Addendum)
Medication Instructions:  Your physician recommends that you continue on your current medications as directed. Please refer to the Current Medication list given to you today.  Labwork: NONE  Testing/Procedures: Your physician has requested that you have an echocardiogram. Echocardiography is a painless test that uses sound waves to create images of your heart. It provides your doctor with information about the size and shape of your heart and how well your heart's chambers and valves are working. This procedure takes approximately one hour. There are no restrictions for this procedure.  Your physician has recommended that you have a cardiopulmonary stress test (CPX). CPX testing is a non-invasive measurement of heart and lung function. It replaces a traditional treadmill stress test. This type of test provides a tremendous amount of information that relates not only to your present condition but also for future outcomes. This test combines measurements of you ventilation, respiratory gas exchange in the lungs, electrocardiogram (EKG), blood pressure and physical response before, during, and following an exercise protocol.  Follow-Up: Your physician wants you to follow-up as needed Dr. Johnsie Cancel.    If you need a refill on your cardiac medications before your next appointment, please call your pharmacy.

## 2017-11-14 MED FILL — ZOLPIDEM TARTRATE 10 MG TAB: 10 | 30 days supply | Qty: 30 | Fill #0

## 2017-11-22 ENCOUNTER — Other Ambulatory Visit: Payer: Self-pay

## 2017-11-22 ENCOUNTER — Ambulatory Visit (HOSPITAL_COMMUNITY): Payer: 59 | Attending: Cardiology

## 2017-11-22 DIAGNOSIS — I350 Nonrheumatic aortic (valve) stenosis: Secondary | ICD-10-CM

## 2017-11-22 DIAGNOSIS — I340 Nonrheumatic mitral (valve) insufficiency: Secondary | ICD-10-CM | POA: Insufficient documentation

## 2017-11-26 ENCOUNTER — Ambulatory Visit (HOSPITAL_COMMUNITY): Payer: 59 | Attending: Cardiovascular Disease

## 2017-11-26 DIAGNOSIS — I350 Nonrheumatic aortic (valve) stenosis: Secondary | ICD-10-CM | POA: Diagnosis not present

## 2017-11-27 ENCOUNTER — Telehealth: Payer: Self-pay | Admitting: Cardiovascular Disease

## 2017-11-27 NOTE — Telephone Encounter (Signed)
Will call patient with Cardiopulmonary Exercise Test. Per Dr. Johnsie Cancel, Mild restrictive lung disease some deconditioning overall heart performance totally normal.  Patient aware of test results.

## 2017-11-27 NOTE — Telephone Encounter (Signed)
Pt rtrn call to Pam-pls call [phone]

## 2018-01-04 MED FILL — ZOLPIDEM TARTRATE 10 MG TAB: 10 | 30 days supply | Qty: 30 | Fill #1

## 2018-01-04 MED FILL — AMLODIPINE BESYLATE 5 MG TA: 5 | 90 days supply | Qty: 90 | Fill #2

## 2018-01-04 MED FILL — BENAZEPRIL HCL 20 MG TABLET: 20 | 90 days supply | Qty: 90 | Fill #2

## 2018-01-04 MED FILL — SYNTHROID 137 MCG TABLET: 137 | 90 days supply | Qty: 90 | Fill #1

## 2018-01-28 MED FILL — OMEPRAZOLE 20 MG CAP: 20 | 90 days supply | Qty: 180 | Fill #1

## 2018-02-20 MED FILL — ZOLPIDEM TARTRATE 10 MG TAB: 10 | 30 days supply | Qty: 30 | Fill #2

## 2018-02-21 MED FILL — ATORVASTATIN 40 MG TABLET: 40 | 90 days supply | Qty: 90 | Fill #0

## 2018-03-27 DIAGNOSIS — E039 Hypothyroidism, unspecified: Secondary | ICD-10-CM | POA: Diagnosis not present

## 2018-03-27 DIAGNOSIS — I10 Essential (primary) hypertension: Secondary | ICD-10-CM | POA: Diagnosis not present

## 2018-03-27 DIAGNOSIS — E7800 Pure hypercholesterolemia, unspecified: Secondary | ICD-10-CM | POA: Diagnosis not present

## 2018-03-27 DIAGNOSIS — Z0000 Encounter for general adult medical examination without abnormal findings: Secondary | ICD-10-CM | POA: Diagnosis not present

## 2018-04-02 DIAGNOSIS — Z124 Encounter for screening for malignant neoplasm of cervix: Secondary | ICD-10-CM | POA: Diagnosis not present

## 2018-04-02 DIAGNOSIS — Z780 Asymptomatic menopausal state: Secondary | ICD-10-CM | POA: Diagnosis not present

## 2018-04-02 DIAGNOSIS — R87612 Low grade squamous intraepithelial lesion on cytologic smear of cervix (LGSIL): Secondary | ICD-10-CM | POA: Diagnosis not present

## 2018-04-02 MED FILL — VENLAFAXINE HCL ER 75 MG CA: 75 | 30 days supply | Qty: 30 | Fill #0

## 2018-04-03 DIAGNOSIS — E7800 Pure hypercholesterolemia, unspecified: Secondary | ICD-10-CM | POA: Diagnosis not present

## 2018-04-03 DIAGNOSIS — K219 Gastro-esophageal reflux disease without esophagitis: Secondary | ICD-10-CM | POA: Diagnosis not present

## 2018-04-03 DIAGNOSIS — I10 Essential (primary) hypertension: Secondary | ICD-10-CM | POA: Diagnosis not present

## 2018-04-03 DIAGNOSIS — C8190 Hodgkin lymphoma, unspecified, unspecified site: Secondary | ICD-10-CM | POA: Diagnosis not present

## 2018-04-03 DIAGNOSIS — K7689 Other specified diseases of liver: Secondary | ICD-10-CM | POA: Diagnosis not present

## 2018-04-03 DIAGNOSIS — Z0000 Encounter for general adult medical examination without abnormal findings: Secondary | ICD-10-CM | POA: Diagnosis not present

## 2018-04-03 DIAGNOSIS — C50919 Malignant neoplasm of unspecified site of unspecified female breast: Secondary | ICD-10-CM | POA: Diagnosis not present

## 2018-04-03 DIAGNOSIS — K222 Esophageal obstruction: Secondary | ICD-10-CM | POA: Diagnosis not present

## 2018-04-03 DIAGNOSIS — E039 Hypothyroidism, unspecified: Secondary | ICD-10-CM | POA: Diagnosis not present

## 2018-04-03 MED FILL — AMLODIPINE BESYLATE 5 MG TA: 5 | 90 days supply | Qty: 90 | Fill #0

## 2018-04-03 MED FILL — SYNTHROID 137 MCG TABLET: 137 | 90 days supply | Qty: 90 | Fill #2

## 2018-04-03 MED FILL — BENAZEPRIL HCL 20 MG TABLET: 20 | 90 days supply | Qty: 90 | Fill #0

## 2018-05-20 DIAGNOSIS — H40053 Ocular hypertension, bilateral: Secondary | ICD-10-CM | POA: Diagnosis not present

## 2018-05-20 DIAGNOSIS — S0502XA Injury of conjunctiva and corneal abrasion without foreign body, left eye, initial encounter: Secondary | ICD-10-CM | POA: Diagnosis not present

## 2018-05-20 DIAGNOSIS — H40023 Open angle with borderline findings, high risk, bilateral: Secondary | ICD-10-CM | POA: Diagnosis not present

## 2018-05-23 DIAGNOSIS — K222 Esophageal obstruction: Secondary | ICD-10-CM | POA: Diagnosis not present

## 2018-05-23 DIAGNOSIS — Z1211 Encounter for screening for malignant neoplasm of colon: Secondary | ICD-10-CM | POA: Diagnosis not present

## 2018-05-23 DIAGNOSIS — K648 Other hemorrhoids: Secondary | ICD-10-CM | POA: Diagnosis not present

## 2018-05-23 DIAGNOSIS — R1310 Dysphagia, unspecified: Secondary | ICD-10-CM | POA: Diagnosis not present

## 2018-05-23 DIAGNOSIS — D132 Benign neoplasm of duodenum: Secondary | ICD-10-CM | POA: Diagnosis not present

## 2018-05-23 DIAGNOSIS — K3189 Other diseases of stomach and duodenum: Secondary | ICD-10-CM | POA: Diagnosis not present

## 2018-05-23 DIAGNOSIS — D131 Benign neoplasm of stomach: Secondary | ICD-10-CM | POA: Diagnosis not present

## 2018-05-23 DIAGNOSIS — K449 Diaphragmatic hernia without obstruction or gangrene: Secondary | ICD-10-CM | POA: Diagnosis not present

## 2018-05-23 DIAGNOSIS — K317 Polyp of stomach and duodenum: Secondary | ICD-10-CM | POA: Diagnosis not present

## 2018-05-24 DIAGNOSIS — K317 Polyp of stomach and duodenum: Secondary | ICD-10-CM | POA: Diagnosis not present

## 2018-05-24 DIAGNOSIS — K3189 Other diseases of stomach and duodenum: Secondary | ICD-10-CM | POA: Diagnosis not present

## 2018-05-24 DIAGNOSIS — K922 Gastrointestinal hemorrhage, unspecified: Secondary | ICD-10-CM | POA: Diagnosis not present

## 2018-07-05 MED FILL — BENAZEPRIL HCL 20 MG TABLET: 20 | 90 days supply | Qty: 90 | Fill #1

## 2018-07-05 MED FILL — OMEPRAZOLE 20 MG CPDR: 20 | 90 days supply | Qty: 180 | Fill #2

## 2018-07-05 MED FILL — SYNTHROID 137 MCG TABLET: 137 | 90 days supply | Qty: 90 | Fill #3

## 2018-07-05 MED FILL — AMLODIPINE BESYLATE 5 MG TA: 5 | 90 days supply | Qty: 90 | Fill #1

## 2018-07-22 MED FILL — ZOLPIDEM TARTRATE 10 MG TAB: 10 | 30 days supply | Qty: 30 | Fill #0

## 2018-09-02 DIAGNOSIS — H35033 Hypertensive retinopathy, bilateral: Secondary | ICD-10-CM | POA: Diagnosis not present

## 2018-09-02 DIAGNOSIS — H40053 Ocular hypertension, bilateral: Secondary | ICD-10-CM | POA: Diagnosis not present

## 2018-09-02 DIAGNOSIS — H40023 Open angle with borderline findings, high risk, bilateral: Secondary | ICD-10-CM | POA: Diagnosis not present

## 2018-09-24 DIAGNOSIS — Z9089 Acquired absence of other organs: Secondary | ICD-10-CM | POA: Diagnosis not present

## 2018-09-24 DIAGNOSIS — Z800 Family history of malignant neoplasm of digestive organs: Secondary | ICD-10-CM | POA: Diagnosis not present

## 2018-09-24 DIAGNOSIS — K317 Polyp of stomach and duodenum: Secondary | ICD-10-CM | POA: Diagnosis not present

## 2018-09-24 DIAGNOSIS — R4702 Dysphasia: Secondary | ICD-10-CM | POA: Diagnosis not present

## 2018-09-24 DIAGNOSIS — Z1211 Encounter for screening for malignant neoplasm of colon: Secondary | ICD-10-CM | POA: Diagnosis not present

## 2018-09-24 DIAGNOSIS — Z853 Personal history of malignant neoplasm of breast: Secondary | ICD-10-CM | POA: Diagnosis not present

## 2018-09-24 DIAGNOSIS — D131 Benign neoplasm of stomach: Secondary | ICD-10-CM | POA: Diagnosis not present

## 2018-09-24 DIAGNOSIS — K449 Diaphragmatic hernia without obstruction or gangrene: Secondary | ICD-10-CM | POA: Diagnosis not present

## 2018-10-07 MED FILL — AMLODIPINE BESYLATE 5 MG TA: 5 | 90 days supply | Qty: 90 | Fill #2

## 2018-10-07 MED FILL — SYNTHROID 137 MCG TABLET: 137 | 90 days supply | Qty: 90 | Fill #0

## 2018-10-07 MED FILL — BENAZEPRIL HCL 20 MG TABLET: 20 | 90 days supply | Qty: 90 | Fill #2

## 2018-10-07 MED FILL — ATORVASTATIN 40 MG TABLET: 40 | 90 days supply | Qty: 90 | Fill #1

## 2018-10-10 MED FILL — ZOLPIDEM TARTRATE 5 MG TAB: 5 | 30 days supply | Qty: 30 | Fill #0

## 2018-10-21 DIAGNOSIS — D131 Benign neoplasm of stomach: Secondary | ICD-10-CM | POA: Diagnosis not present

## 2018-10-24 DIAGNOSIS — D131 Benign neoplasm of stomach: Secondary | ICD-10-CM | POA: Diagnosis not present

## 2018-10-24 DIAGNOSIS — K3189 Other diseases of stomach and duodenum: Secondary | ICD-10-CM | POA: Diagnosis not present

## 2018-10-24 DIAGNOSIS — K317 Polyp of stomach and duodenum: Secondary | ICD-10-CM | POA: Diagnosis not present

## 2018-10-24 DIAGNOSIS — Z800 Family history of malignant neoplasm of digestive organs: Secondary | ICD-10-CM | POA: Diagnosis not present

## 2018-10-24 DIAGNOSIS — Z9089 Acquired absence of other organs: Secondary | ICD-10-CM | POA: Diagnosis not present

## 2018-10-24 DIAGNOSIS — K219 Gastro-esophageal reflux disease without esophagitis: Secondary | ICD-10-CM | POA: Diagnosis not present

## 2018-10-28 DIAGNOSIS — K317 Polyp of stomach and duodenum: Secondary | ICD-10-CM | POA: Diagnosis not present

## 2018-11-14 MED FILL — OMEPRAZOLE 10 MG CPDR: 10 | 30 days supply | Qty: 60 | Fill #0

## 2018-11-22 DIAGNOSIS — Z86006 Personal history of melanoma in-situ: Secondary | ICD-10-CM | POA: Diagnosis not present

## 2018-11-22 DIAGNOSIS — L821 Other seborrheic keratosis: Secondary | ICD-10-CM | POA: Diagnosis not present

## 2018-11-22 DIAGNOSIS — Z1283 Encounter for screening for malignant neoplasm of skin: Secondary | ICD-10-CM | POA: Diagnosis not present

## 2018-11-22 DIAGNOSIS — Z08 Encounter for follow-up examination after completed treatment for malignant neoplasm: Secondary | ICD-10-CM | POA: Diagnosis not present

## 2018-12-17 MED FILL — valACYclovir HCL 1 GM TABS: 1 | 75 days supply | Qty: 90 | Fill #0

## 2019-01-01 DIAGNOSIS — J101 Influenza due to other identified influenza virus with other respiratory manifestations: Secondary | ICD-10-CM | POA: Diagnosis not present

## 2019-01-01 DIAGNOSIS — R6889 Other general symptoms and signs: Secondary | ICD-10-CM | POA: Diagnosis not present

## 2019-01-01 DIAGNOSIS — Z23 Encounter for immunization: Secondary | ICD-10-CM | POA: Diagnosis not present

## 2019-01-13 MED FILL — AMLODIPINE BESYLATE 5 MG TA: 5 | 90 days supply | Qty: 90 | Fill #0

## 2019-01-13 MED FILL — BENAZEPRIL HCL 20 MG TABLET: 20 | 90 days supply | Qty: 90 | Fill #0

## 2019-01-13 MED FILL — OMEPRAZOLE 10 MG CPDR: 10 | 30 days supply | Qty: 60 | Fill #0

## 2019-01-13 MED FILL — ZOLPIDEM TARTRATE 5 MG TAB: 5 | 30 days supply | Qty: 30 | Fill #0

## 2019-01-13 MED FILL — ATORVASTATIN 40 MG TABLET: 40 | 90 days supply | Qty: 90 | Fill #0

## 2019-01-14 MED FILL — SYNTHROID 137 MCG TABLET: 137 | 30 days supply | Qty: 30 | Fill #0

## 2019-02-17 MED FILL — SYNTHROID 137 MCG TABLET: 137 | 90 days supply | Qty: 90 | Fill #0

## 2019-04-07 MED FILL — OMEPRAZOLE 10 MG CPDR: 10 | 30 days supply | Qty: 60 | Fill #0

## 2019-04-07 MED FILL — AMLODIPINE BESYLATE 5 MG TA: 5 | 90 days supply | Qty: 90 | Fill #0

## 2019-04-07 MED FILL — BENAZEPRIL HCL 20 MG TABLET: 20 | 90 days supply | Qty: 90 | Fill #0

## 2019-05-12 DIAGNOSIS — R7989 Other specified abnormal findings of blood chemistry: Secondary | ICD-10-CM | POA: Diagnosis not present

## 2019-05-12 DIAGNOSIS — Z0000 Encounter for general adult medical examination without abnormal findings: Secondary | ICD-10-CM | POA: Diagnosis not present

## 2019-05-12 DIAGNOSIS — R7309 Other abnormal glucose: Secondary | ICD-10-CM | POA: Diagnosis not present

## 2019-05-16 DIAGNOSIS — Z7189 Other specified counseling: Secondary | ICD-10-CM | POA: Diagnosis not present

## 2019-05-16 DIAGNOSIS — E890 Postprocedural hypothyroidism: Secondary | ICD-10-CM | POA: Diagnosis not present

## 2019-05-16 DIAGNOSIS — K317 Polyp of stomach and duodenum: Secondary | ICD-10-CM | POA: Diagnosis not present

## 2019-05-16 DIAGNOSIS — Z0000 Encounter for general adult medical examination without abnormal findings: Secondary | ICD-10-CM | POA: Diagnosis not present

## 2019-05-16 DIAGNOSIS — Z853 Personal history of malignant neoplasm of breast: Secondary | ICD-10-CM | POA: Diagnosis not present

## 2019-05-16 DIAGNOSIS — Z23 Encounter for immunization: Secondary | ICD-10-CM | POA: Diagnosis not present

## 2019-05-16 DIAGNOSIS — Z9081 Acquired absence of spleen: Secondary | ICD-10-CM | POA: Diagnosis not present

## 2019-05-16 DIAGNOSIS — Z8579 Personal history of other malignant neoplasms of lymphoid, hematopoietic and related tissues: Secondary | ICD-10-CM | POA: Diagnosis not present

## 2019-05-23 MED FILL — SYNTHROID 137 MCG TABLET: 137 | 90 days supply | Qty: 90 | Fill #0

## 2019-06-06 MED FILL — OMEPRAZOLE 10 MG CPDR: 10 | 30 days supply | Qty: 60 | Fill #1

## 2019-07-17 MED FILL — BENAZEPRIL HCL 20 MG TABLET: 20 | 90 days supply | Qty: 90 | Fill #1

## 2019-07-17 MED FILL — AMLODIPINE BESYLATE 5 MG TA: 5 | 90 days supply | Qty: 90 | Fill #1

## 2019-07-18 MED FILL — OMEPRAZOLE 10 MG CPDR: 10 | 30 days supply | Qty: 60 | Fill #0

## 2019-07-21 ENCOUNTER — Telehealth: Payer: Self-pay

## 2019-07-21 DIAGNOSIS — R0602 Shortness of breath: Secondary | ICD-10-CM

## 2019-07-21 DIAGNOSIS — R011 Cardiac murmur, unspecified: Secondary | ICD-10-CM

## 2019-07-21 NOTE — Telephone Encounter (Signed)
Per Dr. Burt Knack, scheduled patient for echo (07/28/2019) and follow-up visit (08/11/2019). Dx: Murmur, SOB The patient was grateful for call and agrees with treatment plan.

## 2019-07-28 ENCOUNTER — Other Ambulatory Visit: Payer: Self-pay

## 2019-07-28 ENCOUNTER — Ambulatory Visit (HOSPITAL_COMMUNITY): Payer: 59 | Attending: Cardiology

## 2019-07-28 DIAGNOSIS — R011 Cardiac murmur, unspecified: Secondary | ICD-10-CM | POA: Diagnosis not present

## 2019-07-28 DIAGNOSIS — R0602 Shortness of breath: Secondary | ICD-10-CM | POA: Diagnosis not present

## 2019-07-28 DIAGNOSIS — Z23 Encounter for immunization: Secondary | ICD-10-CM | POA: Diagnosis not present

## 2019-07-28 MED ORDER — PERFLUTREN LIPID MICROSPHERE
1.0000 mL | INTRAVENOUS | Status: AC | PRN
  Administered 2019-07-28: 2 mL via INTRAVENOUS

## 2019-08-11 ENCOUNTER — Ambulatory Visit: Payer: 59 | Admitting: Cardiovascular Disease

## 2019-08-11 ENCOUNTER — Other Ambulatory Visit: Payer: Self-pay

## 2019-08-11 ENCOUNTER — Encounter: Payer: Self-pay | Admitting: Cardiovascular Disease

## 2019-08-11 VITALS — BP 122/78 | HR 95 | Ht 62.5 in | Wt 153.1 lb

## 2019-08-11 DIAGNOSIS — E782 Mixed hyperlipidemia: Secondary | ICD-10-CM

## 2019-08-11 DIAGNOSIS — R0602 Shortness of breath: Secondary | ICD-10-CM

## 2019-08-11 DIAGNOSIS — I10 Essential (primary) hypertension: Secondary | ICD-10-CM

## 2019-08-11 DIAGNOSIS — R0989 Other specified symptoms and signs involving the circulatory and respiratory systems: Secondary | ICD-10-CM | POA: Diagnosis not present

## 2019-08-11 DIAGNOSIS — I350 Nonrheumatic aortic (valve) stenosis: Secondary | ICD-10-CM

## 2019-08-11 MED FILL — SYNTHROID 137 MCG TABLET: 137 | 90 days supply | Qty: 90 | Fill #1

## 2019-08-11 NOTE — Patient Instructions (Signed)
Medication Instructions:  Your provider recommends that you continue on your current medications as directed. Please refer to the Current Medication list given to you today.   *If you need a refill on your cardiac medications before your next appointment, please call your pharmacy*  Testing/Procedures: Your physician has requested that you have a carotid duplex. This test is an ultrasound of the carotid arteries in your neck. It looks at blood flow through these arteries that supply the brain with blood. Allow one hour for this exam. There are no restrictions or special instructions.   Follow-Up: At [facility], you and your health needs are our priority.  As part of our continuing mission to provide you with exceptional heart care, we have created designated Provider Care Teams.  These Care Teams include your primary Cardiologist (physician) and Advanced Practice Providers (APPs -  Physician Assistants and Nurse Practitioners) who all work together to provide you with the care you need, when you need it. Your next appointment:   12 months The format for your next appointment:   In Person Provider:   You may see Sherren Mocha, MD or one of the following Advanced Practice Providers on your designated Care Team:    Richardson Dopp, PA-C  Vin [place], [place]  Daune Perch, [place]

## 2019-08-11 NOTE — Progress Notes (Signed)
Cardiology Office Note:    Date:  08/11/2019   ID:  MIKHAELA GIORGI, DOB 1970/10/26, MRN [id number]  PCP:  [name], DO  Cardiologist:  Sherren Mocha, MD  Electrophysiologist:  None   Referring MD: Everardo All, MD   Chief Complaint  Patient presents with   Shortness of Breath   History of Present Illness:    Brooke Gomez is a 48 y.o. female with a hx of exertional dyspnea, presenting for follow-up evaluation.  This is the patient's first visit with me.  She was last seen in our office about 2 years ago.  The patient has a history of Hodgkin's lymphoma and underwent thoracotomy and was treated with chemo and radiation.  She is here alone today.  She complains of shortness of breath with physical exertion.  When walking at a normal pace she has no symptoms.  However, she developed shortness of breath with walking upstairs or in a hurry.  She denies orthopnea or PND.  She has occasional fleeting chest pains that occur at rest.  There is no exertional chest pain or pressure present.  She has mild leg swelling that primarily occurs after she has been up on her feet all day.  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   aspirin 81 MG tablet Take 81 mg by mouth daily.    atorvastatin (LIPITOR) 40 MG tablet Take 40 mg by mouth  daily at 6 PM.    benazepril (LOTENSIN) 20 MG tablet Take 20 mg by mouth daily.   Calcium Carbonate-Vitamin D (CALTRATE 600+D PO) Take 1 tablet by mouth 2 (two) times daily.   Multiple Vitamins-Minerals (MULTIVITAMIN PO) Take 1 tablet by mouth daily.   Omega-3 Fatty Acids (FISH OIL) 1200 MG CAPS Take 1 capsule by mouth 2 (two) times daily.   omeprazole (PRILOSEC) 10 MG capsule Take 10 mg by mouth 2 (two) times daily.   SYNTHROID 137 MCG tablet Take 137 mcg by mouth daily.   valACYclovir (VALTREX) 500 MG tablet Take 1,000 mg by mouth 2 (two) times daily as needed.   zolpidem (AMBIEN) 10 MG tablet Take 5 mg by mouth at bedtime as needed for sleep.      Allergies:   Penicillins, Cephalosporins, and Chlorhexidine gluconate   Social History   Socioeconomic History   Marital status: Single    Spouse name: Not on file   Number of children: Not on file   Years of education: Not on file   Highest education level: Not on file  Occupational History   Occupation: PA    Comment: Cardiothoracic Surgery at [employer] resource strain: Not on  file   Food insecurity    Worry: Not on file    Inability: Not on file   Transportation needs    Medical: Not on file    Non-medical: Not on file  Tobacco Use   Smoking status: Never Smoker   Smokeless tobacco: Never Used  Substance and Sexual Activity   Alcohol use: Yes    Alcohol/week: 7.0 - 10.0 standard drinks    Types: 7 - 10 Glasses of wine per week    Comment: a glass of wine daily   Drug use: No   Sexual activity: Yes    Birth control/protection: Surgical  Lifestyle   Physical activity    Days per week: Not on file    Minutes per session: Not on file   Stress: Not on file  Relationships   Social connections    Talks on phone: Not on file    Gets together: Not on file    Attends religious service: Not on file    Active member of club or organization: Not on file    Attends meetings of  clubs or organizations: Not on file    Relationship status: Not on file  Other Topics Concern   Not on file  Social History Narrative   Not on file     Family History: The patient's family history includes Breast cancer in her paternal aunt; Cancer (age of onset: 51) in her mother; Colon cancer in her paternal grandfather; Hyperlipidemia in her father; Hypertension in her mother; Scleroderma (age of onset: 32) in her sister.  ROS:   Please see the history of present illness.    All other systems reviewed and are negative.  EKGs/Labs/Other Studies Reviewed:    The following studies were reviewed today: Echo 07-28-2019: IMPRESSIONS    1. Left ventricular ejection fraction, by visual estimation, is 60 to 65%. The left ventricle has normal function. Normal left ventricular size. There is no left ventricular hypertrophy.  2. Elevated mean left atrial pressure.  3. Left ventricular diastolic Doppler parameters are consistent with impaired relaxation pattern of LV diastolic filling.  4. Global right ventricle has normal systolic function.The right ventricular size is normal. No increase in right ventricular wall thickness.  5. Left atrial size was normal.  6. Right atrial size was normal.  7. Mild mitral annular calcification.  8. The mitral valve is normal in structure. No evidence of mitral valve regurgitation. No evidence of mitral stenosis.  9. The tricuspid valve is normal in structure. Tricuspid valve regurgitation was not visualized by color flow Doppler. 10. The aortic valve is abnormal Aortic valve regurgitation was not visualized by color flow Doppler. Mild to moderate aortic valve sclerosis/calcification without any evidence of aortic stenosis. 11. The pulmonic valve was normal in structure. Pulmonic valve regurgitation is not visualized by color flow Doppler. 12. The inferior vena cava is normal in size with greater than 50% respiratory variability, suggesting right atrial  pressure of 3 mmHg.  LEFT VENTRICLE PLAX 2D LVIDd:         3.70 cm Diastology LVIDs:         2.60 cm LV e' lateral:   6.42 cm/s LV PW:         0.80 cm LV E/e' lateral: 22.3 LV IVS:        0.80 cm LV e' medial:    5.66 cm/s LV SV:         34 ml   LV E/e' medial:  25.3 LV SV  Index:   19.15    RIGHT VENTRICLE RV Basal diam:  2.90 cm RV S prime:     14.80 cm/s TAPSE (M-mode): 2.3 cm  LEFT ATRIUM             Index       RIGHT ATRIUM           Index LA diam:        3.70 cm 2.17 cm/m  RA Area:     10.40 cm LA Vol (A2C):   45.1 ml 26.50 ml/m RA Volume:   24.40 ml  14.34 ml/m LA Vol (A4C):   27.5 ml 16.16 ml/m LA Biplane Vol: 36.5 ml 21.45 ml/m  AORTIC VALVE AV Vmax:           182.80 cm/s AV Vmean:          131.800 cm/s AV VTI:            0.360 m AV Peak Grad:      13.4 mmHg AV Mean Grad:      7.8 mmHg LVOT Vmax:         100.00 cm/s LVOT Vmean:        65.800 cm/s LVOT VTI:          0.209 m LVOT/AV VTI ratio: 0.58   AORTA Ao Root diam: 2.90 cm Ao Asc diam:  3.00 cm  MITRAL VALVE MV Area (PHT): 2.80 cm              SHUNTS MV PHT:        78.59 msec            Systemic VTI: 0.21 m MV Decel Time: 271 msec MV E velocity: 143.00 cm/s 103 cm/s MV A velocity: 150.00 cm/s 70.3 cm/s MV E/A ratio:  0.95        1.5  EKG:  EKG is ordered today.  The ekg ordered today demonstrates normal sinus rhythm 87 bpm, nonspecific ST abnormality.  Recent Labs: No results found for requested labs within last 8760 hours.  Recent Lipid Panel    Component Value Date/Time   CHOL 145 04/15/2012 1020   TRIG 108 04/15/2012 1020   TRIG 175 02/24/2009   HDL 68 04/15/2012 1020   CHOLHDL 2.1 04/15/2012 1020   VLDL 22 04/15/2012 1020   LDLCALC 55 04/15/2012 1020    Physical Exam:    VS:  BP 122/78    Pulse 95    Ht 5' 2.5" (1.588 m)    Wt 153 lb 1.9 oz (69.5 kg)    SpO2 97%    BMI 27.56 kg/m     Wt Readings from Last 3 Encounters:  08/11/19 153 lb 1.9 oz (69.5 kg)  11/02/17 150 lb (68  kg)  12/13/15 149 lb 12.8 oz (67.9 kg)     GEN: Well nourished, well developed in no acute distress HEENT: Normal NECK: No JVD; No carotid bruits LYMPHATICS: No lymphadenopathy CARDIAC: RRR, 2/6 ejection murmur, early peaking, at the right upper sternal border.  No diastolic murmur. RESPIRATORY:  Clear to auscultation without rales, wheezing or rhonchi  ABDOMEN: Soft, non-tender, non-distended MUSCULOSKELETAL: Trace bilateral pretibial edema; No deformity  SKIN: Warm and dry NEUROLOGIC:  Alert and oriented x 3 PSYCHIATRIC:  Normal affect   ASSESSMENT:    1. Carotid bruit, unspecified laterality   2. Shortness of breath   3. Essential hypertension   4. Aortic valve stenosis, etiology of cardiac valve disease unspecified   5. Mixed hyperlipidemia  PLAN:    In order of problems listed above:  1. The patient has bilateral carotid bruits which are probably transmission of her aortic outflow murmur.  However, she has been treated with radiation and is at risk for radiation-related carotid arterial disease.  I recommended a carotid duplex scan to evaluate. 2. I suspect this is multifactorial.  I carefully reviewed her echo images which show some degenerative changes of her mitral and aortic valve, but no functional valvular abnormalities.  LV function is normal.  She does have diastolic dysfunction but has well-controlled blood pressure and normal volume status.  She will work on initiating an exercise program to try to improve her exertional dyspnea.   3. Blood pressure is very well controlled on a combination of amlodipine and benazepril. 4. The patient has calcification at the noncoronary cusp of her aortic valve.  Her leaflet mobility otherwise is fairly normal.  Her transvalvular gradients are very mildly elevated and I think this can be followed about every other year by echo studies.  I would like to do another exam next year.  We discussed the relationship between chest radiation  for treatment of Hodgkin's disease and development of aortic stenosis later in life.  5. 5. Lipids excellent on statin Rx with LDL < 70 mg/dL.   Medication Adjustments/Labs and Tests Ordered: Current medicines are reviewed at length with the patient today.  Concerns regarding medicines are outlined above.  Orders Placed This Encounter  Procedures   EKG 12-Lead   VAS US CAROTID   No orders of the defined types were placed in this encounter.   Patient Instructions  Medication Instructions:  Your provider recommends that you continue on your current medications as directed. Please refer to the Current Medication list given to you today.   *If you need a refill on your cardiac medications before your next appointment, please call your pharmacy*  Testing/Procedures: Your physician has requested that you have a carotid duplex. This test is an ultrasound of the carotid arteries in your neck. It looks at blood flow through these arteries that supply the brain with blood. Allow one hour for this exam. There are no restrictions or special instructions.   Follow-Up: At [facility], you and your health needs are our priority.  As part of our continuing mission to provide you with exceptional heart care, we have created designated Provider Care Teams.  These Care Teams include your primary Cardiologist (physician) and Advanced Practice Providers (APPs -  Physician Assistants and Nurse Practitioners) who all work together to provide you with the care you need, when you need it. Your next appointment:   12 months The format for your next appointment:   In Person Provider:   You may see Sherren Mocha, MD or one of the following Advanced Practice Providers on your designated Care Team:    Richardson Dopp, PA-C  Vin Dallas, PA-C  Daune Perch, [place]    Signed, Sherren Mocha, MD  08/11/2019 1:24 PM    [place]

## 2019-08-29 ENCOUNTER — Telehealth (HOSPITAL_COMMUNITY): Payer: Self-pay

## 2019-08-29 MED FILL — OMEPRAZOLE 10 MG CPDR: 10 | 30 days supply | Qty: 60 | Fill #1

## 2019-08-29 NOTE — Telephone Encounter (Signed)

## 2019-09-01 ENCOUNTER — Other Ambulatory Visit: Payer: Self-pay

## 2019-09-01 ENCOUNTER — Ambulatory Visit (HOSPITAL_COMMUNITY)
Admission: RE | Admit: 2019-09-01 | Discharge: 2019-09-01 | Disposition: A | Discharge status: Home / Self Care | Payer: 59 | Source: Ambulatory Visit | Attending: Surgery | Admitting: Surgery

## 2019-09-01 DIAGNOSIS — R0989 Other specified symptoms and signs involving the circulatory and respiratory systems: Secondary | ICD-10-CM

## 2019-10-15 MED FILL — BENAZEPRIL HCL 20 MG TABLET: 20 | 90 days supply | Qty: 90 | Fill #2

## 2019-10-15 MED FILL — OMEPRAZOLE 10 MG CPDR: 10 | 30 days supply | Qty: 60 | Fill #2

## 2019-10-15 MED FILL — AMLODIPINE BESYLATE 5 MG TA: 5 | 90 days supply | Qty: 90 | Fill #2

## 2019-11-03 MED FILL — ATORVASTATIN 40 MG TABLET: 40 | 90 days supply | Qty: 90 | Fill #0

## 2019-11-04 MED FILL — ZOLPIDEM TARTRATE 5 MG TAB: 5 | 30 days supply | Qty: 30 | Fill #0

## 2019-11-13 DIAGNOSIS — K449 Diaphragmatic hernia without obstruction or gangrene: Secondary | ICD-10-CM | POA: Diagnosis not present

## 2019-11-13 DIAGNOSIS — R1310 Dysphagia, unspecified: Secondary | ICD-10-CM | POA: Diagnosis not present

## 2019-11-13 DIAGNOSIS — K317 Polyp of stomach and duodenum: Secondary | ICD-10-CM | POA: Diagnosis not present

## 2019-11-13 DIAGNOSIS — K3189 Other diseases of stomach and duodenum: Secondary | ICD-10-CM | POA: Diagnosis not present

## 2019-11-13 DIAGNOSIS — K219 Gastro-esophageal reflux disease without esophagitis: Secondary | ICD-10-CM | POA: Diagnosis not present

## 2019-11-13 DIAGNOSIS — K222 Esophageal obstruction: Secondary | ICD-10-CM | POA: Diagnosis not present

## 2019-11-13 MED FILL — OMEPRAZOLE DR 20 MG CAPSULE: 20 | 90 days supply | Qty: 90 | Fill #0

## 2019-11-17 DIAGNOSIS — H35033 Hypertensive retinopathy, bilateral: Secondary | ICD-10-CM | POA: Diagnosis not present

## 2019-11-17 DIAGNOSIS — H40023 Open angle with borderline findings, high risk, bilateral: Secondary | ICD-10-CM | POA: Diagnosis not present

## 2019-11-17 DIAGNOSIS — H40053 Ocular hypertension, bilateral: Secondary | ICD-10-CM | POA: Diagnosis not present

## 2019-11-17 MED FILL — SYNTHROID 137 MCG TABLET: 137 | 30 days supply | Qty: 30 | Fill #0

## 2019-12-11 MED FILL — SYNTHROID 137 MCG TABLET: 137 | 90 days supply | Qty: 90 | Fill #0

## 2019-12-12 DIAGNOSIS — Z08 Encounter for follow-up examination after completed treatment for malignant neoplasm: Secondary | ICD-10-CM | POA: Diagnosis not present

## 2019-12-12 DIAGNOSIS — Z85820 Personal history of malignant melanoma of skin: Secondary | ICD-10-CM | POA: Diagnosis not present

## 2019-12-12 DIAGNOSIS — D225 Melanocytic nevi of trunk: Secondary | ICD-10-CM | POA: Diagnosis not present

## 2019-12-12 DIAGNOSIS — L570 Actinic keratosis: Secondary | ICD-10-CM | POA: Diagnosis not present

## 2019-12-12 DIAGNOSIS — Z1283 Encounter for screening for malignant neoplasm of skin: Secondary | ICD-10-CM | POA: Diagnosis not present

## 2019-12-12 DIAGNOSIS — X32XXXD Exposure to sunlight, subsequent encounter: Secondary | ICD-10-CM | POA: Diagnosis not present

## 2020-01-16 MED FILL — AMLODIPINE BESYLATE 5 MG TA: 5 | 90 days supply | Qty: 90 | Fill #3

## 2020-01-20 MED FILL — BENAZEPRIL HCL 20 MG TABLET: 20 | 90 days supply | Qty: 90 | Fill #3

## 2020-02-12 MED FILL — ZOLPIDEM TARTRATE 5 MG TAB: 5 | 30 days supply | Qty: 30 | Fill #1

## 2020-02-12 MED FILL — OMEPRAZOLE DR 20 MG CAPSULE: 20 | 90 days supply | Qty: 90 | Fill #1

## 2020-03-16 MED FILL — SYNTHROID 137 MCG TABLET: 137 | 90 days supply | Qty: 90 | Fill #1

## 2020-04-19 MED FILL — BENAZEPRIL HCL 20 MG TABLET: 20 | 90 days supply | Qty: 90 | Fill #0

## 2020-04-19 MED FILL — AMLODIPINE BESYLATE 5 MG TA: 5 | 90 days supply | Qty: 90 | Fill #0

## 2020-04-19 MED FILL — ATORVASTATIN 40 MG TABLET: 40 | 90 days supply | Qty: 90 | Fill #1

## 2020-04-22 ENCOUNTER — Other Ambulatory Visit (HOSPITAL_COMMUNITY): Payer: Self-pay | Admitting: Internal Medicine

## 2020-05-21 DIAGNOSIS — E039 Hypothyroidism, unspecified: Secondary | ICD-10-CM | POA: Diagnosis not present

## 2020-05-21 DIAGNOSIS — E7800 Pure hypercholesterolemia, unspecified: Secondary | ICD-10-CM | POA: Diagnosis not present

## 2020-05-21 DIAGNOSIS — R7309 Other abnormal glucose: Secondary | ICD-10-CM | POA: Diagnosis not present

## 2020-05-21 DIAGNOSIS — I10 Essential (primary) hypertension: Secondary | ICD-10-CM | POA: Diagnosis not present

## 2020-05-24 DIAGNOSIS — Z8579 Personal history of other malignant neoplasms of lymphoid, hematopoietic and related tissues: Secondary | ICD-10-CM | POA: Diagnosis not present

## 2020-05-24 DIAGNOSIS — K219 Gastro-esophageal reflux disease without esophagitis: Secondary | ICD-10-CM | POA: Diagnosis not present

## 2020-05-24 DIAGNOSIS — E7800 Pure hypercholesterolemia, unspecified: Secondary | ICD-10-CM | POA: Diagnosis not present

## 2020-05-24 DIAGNOSIS — Z853 Personal history of malignant neoplasm of breast: Secondary | ICD-10-CM | POA: Diagnosis not present

## 2020-05-24 DIAGNOSIS — Z0000 Encounter for general adult medical examination without abnormal findings: Secondary | ICD-10-CM | POA: Diagnosis not present

## 2020-05-24 DIAGNOSIS — Z23 Encounter for immunization: Secondary | ICD-10-CM | POA: Diagnosis not present

## 2020-05-24 DIAGNOSIS — E890 Postprocedural hypothyroidism: Secondary | ICD-10-CM | POA: Diagnosis not present

## 2020-05-24 DIAGNOSIS — I10 Essential (primary) hypertension: Secondary | ICD-10-CM | POA: Diagnosis not present

## 2020-05-24 DIAGNOSIS — K317 Polyp of stomach and duodenum: Secondary | ICD-10-CM | POA: Diagnosis not present

## 2020-05-26 ENCOUNTER — Other Ambulatory Visit (HOSPITAL_COMMUNITY): Payer: Self-pay | Admitting: Family Medicine

## 2020-05-26 MED FILL — OMEPRAZOLE 10 MG CPDR: 10 | 90 days supply | Qty: 180 | Fill #0

## 2020-07-15 DIAGNOSIS — E890 Postprocedural hypothyroidism: Secondary | ICD-10-CM | POA: Diagnosis not present

## 2020-07-20 ENCOUNTER — Other Ambulatory Visit (HOSPITAL_COMMUNITY): Payer: Self-pay | Admitting: Family Medicine

## 2020-07-20 MED FILL — BENAZEPRIL HCL 20 MG TABLET: 20 | 90 days supply | Qty: 90 | Fill #0

## 2020-07-21 MED FILL — AMLODIPINE BESYLATE 5 MG TA: 5 | 90 days supply | Qty: 90 | Fill #0

## 2020-08-12 DIAGNOSIS — F4322 Adjustment disorder with anxiety: Secondary | ICD-10-CM | POA: Diagnosis not present

## 2020-08-30 ENCOUNTER — Encounter: Payer: Self-pay | Admitting: Cardiovascular Disease

## 2020-08-30 ENCOUNTER — Ambulatory Visit: Payer: 59 | Admitting: Cardiovascular Disease

## 2020-08-30 ENCOUNTER — Other Ambulatory Visit: Payer: Self-pay

## 2020-08-30 VITALS — BP 120/70 | HR 66 | Ht 62.5 in | Wt 136.0 lb

## 2020-08-30 DIAGNOSIS — I10 Essential (primary) hypertension: Secondary | ICD-10-CM | POA: Diagnosis not present

## 2020-08-30 DIAGNOSIS — I358 Other nonrheumatic aortic valve disorders: Secondary | ICD-10-CM

## 2020-08-30 DIAGNOSIS — R0602 Shortness of breath: Secondary | ICD-10-CM

## 2020-08-30 DIAGNOSIS — E782 Mixed hyperlipidemia: Secondary | ICD-10-CM | POA: Diagnosis not present

## 2020-08-30 NOTE — Patient Instructions (Signed)
Medication Instructions:  Your provider recommends that you continue on your current medications as directed. Please refer to the Current Medication list given to you today.   *If you need a refill on your cardiac medications before your next appointment, please call your pharmacy*   Follow-Up: You will be called to arrange your 1 year echo and office visit when Dr. Antionette Char November 2022 schedule is available.

## 2020-08-30 NOTE — Progress Notes (Signed)
Cardiology Office Note:    Date:  08/30/2020   ID:  Brooke Gomez, DOB [birth year], MRN [id number]  PCP:  Brooke Morning, DO  [place] Cardiologist:  Sherren Mocha, MD  [place] Electrophysiologist:  None   Referring MD: Brooke Morning, DO   Chief Complaint  Patient presents with  . Shortness of Breath    History of Present Illness:    Brooke Gomez is a 49 y.o. female with a hx of exertional dyspnea, presenting for follow-up evaluation.  She was last seen here in the office in October 2020.  The patient has a history of Hodgkin's lymphoma treated with thoracotomy, chemotherapy, and radiation.  An echocardiogram was performed last year and this demonstrated an LVEF of 60 to 65% with no LVH.  Doppler parameters suggested impaired relaxation with LV diastolic dysfunction.  RV function was normal.  Atrial size was normal.  There was no mitral regurgitation.  Or tricuspid regurgitation noted.  The aortic valve was noted to be sclerotic with mild to moderate thickening and calcification but no evidence of aortic stenosis.  There was no evidence of pulmonary hypertension.  The patient is here alone today.  She is doing very well.  She has lost significant weight through a dietary program that focuses on food psychology.  Her breathing has improved and her overall energy level is better.  She denies orthopnea, PND, chest pain, or heart palpitations.  Past Medical History:  Diagnosis Date  . 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 Hodgkin's disease    personal history  . Hyperlipemia   . Melanoma ([place])   . Schatzki's ring   . Unspecified essential hypertension   . Unspecified hypothyroidism     Past Surgical History:  Procedure Laterality Date  . APPENDECTOMY    . ESOPHAGEAL DILATION     Multiple times for Schatzki's Ring  . MASTECTOMY     bilateral  . NOVASURE ABLATION    . several lymph node biopies    . SPLENECTOMY    . THORACOTOMY    .  THYROIDECTOMY    . TONSILLECTOMY    . TUBAL LIGATION      Current Medications: Current Meds  Medication Sig  . amLODipine (NORVASC) 5 MG tablet Take 5 mg by mouth daily.  Marland Kitchen aspirin 81 MG tablet Take 81 mg by mouth daily.   Marland Kitchen atorvastatin (LIPITOR) 40 MG tablet Take 40 mg by mouth daily at 6 PM.   . benazepril (LOTENSIN) 20 MG tablet Take 20 mg by mouth daily.  . Calcium Carbonate-Vitamin D (CALTRATE 600+D PO) Take 1 tablet by mouth 2 (two) times daily.  Marland Kitchen levothyroxine (SYNTHROID) 125 MCG tablet Take 125 mcg by mouth daily before breakfast.  . Multiple Vitamins-Minerals (MULTIVITAMIN PO) Take 1 tablet by mouth daily.  . Omega-3 Fatty Acids (FISH OIL) 1200 MG CAPS Take 1 capsule by mouth 2 (two) times daily.  Marland Kitchen omeprazole (PRILOSEC) 10 MG capsule Take 10 mg by mouth daily.  Marland Kitchen SYNTHROID 137 MCG tablet Take 137 mcg by mouth daily.  . valACYclovir (VALTREX) 500 MG tablet Take 1,000 mg by mouth 2 (two) times daily as needed.  . zolpidem (AMBIEN) 10 MG tablet Take 5 mg by mouth at bedtime as needed for sleep.      Allergies:   Penicillins, Cephalosporins, and Chlorhexidine gluconate   Social History   Socioeconomic History  . Marital status: Single    Spouse name: Not on file  .  Number of children: Not on file  . Years of education: Not on file  . Highest education level: Not on file  Occupational History  . Occupation: PA    Comment: Cardiothoracic Surgery at [employer]  . Smoking status: Never Smoker  . Smokeless tobacco: Never Used  Substance and Sexual Activity  . Alcohol use: Yes    Alcohol/week: 7.0 - 10.0 standard drinks    Types: 7 - 10 Glasses of wine per week    Comment: a glass of wine daily  . Drug use: No  . Sexual activity: Yes    Birth control/protection: Surgical  Other Topics Concern  . Not on file  Social History Narrative  . Not on file   Social Determinants of Health   Financial Resource Strain:   . Difficulty of Paying Living Expenses:  Not on file  Food Insecurity:   . Worried About Charity fundraiser in the Last Year: Not on file  . Ran Out of Food in the Last Year: Not on file  Transportation Needs:   . Lack of Transportation (Medical): Not on file  . Lack of Transportation (Non-Medical): Not on file  Physical Activity:   . Days of Exercise per Week: Not on file  . Minutes of Exercise per Session: Not on file  Stress:   . Feeling of Stress : Not on file  Social Connections:   . Frequency of Communication with Friends and Family: Not on file  . Frequency of Social Gatherings with Friends and Family: Not on file  . Attends Religious Services: Not on file  . Active Member of Clubs or Organizations: Not on file  . Attends Archivist Meetings: Not on file  . Marital Status: Not on file     Family History: The patient's family history includes Breast cancer in her paternal aunt; Cancer (age of onset: 59) in her mother; Colon cancer in her paternal grandfather; Hyperlipidemia in her father; Hypertension in her mother; Scleroderma (age of onset: 75) in her sister.  ROS:   Please see the history of present illness.    All other systems reviewed and are negative.  EKGs/Labs/Other Studies Reviewed:    The following studies were reviewed today: Carotid US 09-01-2019: Summary:  Right Carotid: Velocities in the right ICA are consistent with a 1-39%  stenosis.         Non-hemodynamically significant plaque <50% noted in the  CCA.   Left Carotid: Velocities in the left ICA are consistent with a 1-39%  stenosis.        Non-hemodynamically significant plaque <50% noted in the  CCA.   Vertebrals: Bilateral vertebral arteries demonstrate antegrade flow.  Subclavians: Normal flow hemodynamics were seen in bilateral subclavian        arteries.   Echo 07-28-2019: IMPRESSIONS    1. Left ventricular ejection fraction, by visual estimation, is 60 to  65%. The left ventricle has normal  function. Normal left ventricular size.  There is no left ventricular hypertrophy.  2. Elevated mean left atrial pressure.  3. Left ventricular diastolic Doppler parameters are consistent with  impaired relaxation pattern of LV diastolic filling.  4. Global right ventricle has normal systolic function.The right  ventricular size is normal. No increase in right ventricular wall  thickness.  5. Left atrial size was normal.  6. Right atrial size was normal.  7. Mild mitral annular calcification.  8. The mitral valve is normal in structure. No evidence of mitral  valve  regurgitation. No evidence of mitral stenosis.  9. The tricuspid valve is normal in structure. Tricuspid valve  regurgitation was not visualized by color flow Doppler.  10. The aortic valve is abnormal Aortic valve regurgitation was not  visualized by color flow Doppler. Mild to moderate aortic valve  sclerosis/calcification without any evidence of aortic stenosis.  11. The pulmonic valve was normal in structure. Pulmonic valve  regurgitation is not visualized by color flow Doppler.  12. The inferior vena cava is normal in size with greater than 50%  respiratory variability, suggesting right atrial pressure of 3 mmHg.   EKG:  EKG is ordered today.  The ekg ordered today demonstrates normal sinus rhythm 66 bpm, within normal limits.  Recent Labs: No results found for requested labs within last 8760 hours.  Recent Lipid Panel    Component Value Date/Time   CHOL 145 04/15/2012 1020   TRIG 108 04/15/2012 1020   TRIG 175 02/24/2009 0000   HDL 68 04/15/2012 1020   CHOLHDL 2.1 04/15/2012 1020   VLDL 22 04/15/2012 1020   LDLCALC 55 04/15/2012 1020     Risk Assessment/Calculations:       Physical Exam:    VS:  BP 120/70   Pulse 66   Ht 5' 2.5" (1.588 m)   Wt 136 lb (61.7 kg)   SpO2 98%   BMI 24.48 kg/m     Wt Readings from Last 3 Encounters:  08/30/20 136 lb (61.7 kg)  08/11/19 153 lb 1.9 oz (69.5 kg)   11/02/17 150 lb (68 kg)     GEN:  Well nourished, well developed in no acute distress HEENT: Normal NECK: No JVD; soft bilateral carotid bruits LYMPHATICS: No lymphadenopathy CARDIAC: RRR, 2/6 systolic ejection murmur at the right upper sternal border, no diastolic murmur RESPIRATORY:  Clear to auscultation without rales, wheezing or rhonchi  ABDOMEN: Soft, non-tender, non-distended MUSCULOSKELETAL:  No edema; No deformity  SKIN: Warm and dry NEUROLOGIC:  Alert and oriented x 3 PSYCHIATRIC:  Normal affect   ASSESSMENT:    1. Aortic valve sclerosis   2. Mixed hyperlipidemia   3. Essential hypertension   4. Shortness of breath    PLAN:    In order of problems listed above:  1. Exam unchanged.  Suspect changes related to mediastinal radiation therapy.  Repeat echocardiogram next year for surveillance. 2. Lipids excellent with a combination of medical therapy and lifestyle modification.  The patient has done an outstanding job with her diet and exercise program.  She is treated with atorvastatin 40 mg daily.  LDL cholesterol is at goal at 49 mg/dL with normal LFTs reviewed, ALT 38. 3. Blood pressure very well controlled on amlodipine and benazepril.  Creatinine is 0.71, potassium 4.8. 4. Noted to have diastolic dysfunction on echo last year.  Breathing improved with weight loss.  Recommend repeat echo next year.    Shared Decision Making/Informed Consent      Medication Adjustments/Labs and Tests Ordered: Current medicines are reviewed at length with the patient today.  Concerns regarding medicines are outlined above.  Orders Placed This Encounter  Procedures  . EKG 12-Lead   No orders of the defined types were placed in this encounter.   Patient Instructions  Medication Instructions:  Your provider recommends that you continue on your current medications as directed. Please refer to the Current Medication list given to you today.   *If you need a refill on your cardiac  medications before your next appointment, please call  your pharmacy*   Follow-Up: You will be called to arrange your 1 year echo and office visit when Dr. Antionette Char November 2022 schedule is available.    Signed, Sherren Mocha, MD  08/30/2020 1:26 PM    [facility]

## 2020-08-31 DIAGNOSIS — F4322 Adjustment disorder with anxiety: Secondary | ICD-10-CM | POA: Diagnosis not present

## 2020-09-06 DIAGNOSIS — H40023 Open angle with borderline findings, high risk, bilateral: Secondary | ICD-10-CM | POA: Diagnosis not present

## 2020-09-06 DIAGNOSIS — H35033 Hypertensive retinopathy, bilateral: Secondary | ICD-10-CM | POA: Diagnosis not present

## 2020-09-06 DIAGNOSIS — H40053 Ocular hypertension, bilateral: Secondary | ICD-10-CM | POA: Diagnosis not present

## 2020-09-13 DIAGNOSIS — F4322 Adjustment disorder with anxiety: Secondary | ICD-10-CM | POA: Diagnosis not present

## 2020-10-13 ENCOUNTER — Other Ambulatory Visit (HOSPITAL_COMMUNITY): Payer: Self-pay | Admitting: Family Medicine

## 2020-10-13 MED FILL — AMLODIPINE BESYLATE 5 MG TA: 5 | 90 days supply | Qty: 90 | Fill #1

## 2020-10-13 MED FILL — OMEPRAZOLE 10 MG CPDR: 10 | 90 days supply | Qty: 180 | Fill #1

## 2020-10-13 MED FILL — SYNTHROID 125 MCG TABLET: 125 | 90 days supply | Qty: 90 | Fill #0

## 2020-10-13 MED FILL — BENAZEPRIL HCL 20 MG TABLET: 20 | 90 days supply | Qty: 90 | Fill #1

## 2020-11-12 ENCOUNTER — Ambulatory Visit: Payer: 59 | Admitting: Cardiovascular Disease

## 2020-11-26 DIAGNOSIS — Z1283 Encounter for screening for malignant neoplasm of skin: Secondary | ICD-10-CM | POA: Diagnosis not present

## 2020-11-26 DIAGNOSIS — D2271 Melanocytic nevi of right lower limb, including hip: Secondary | ICD-10-CM | POA: Diagnosis not present

## 2020-11-26 DIAGNOSIS — Z08 Encounter for follow-up examination after completed treatment for malignant neoplasm: Secondary | ICD-10-CM | POA: Diagnosis not present

## 2020-11-26 DIAGNOSIS — D485 Neoplasm of uncertain behavior of skin: Secondary | ICD-10-CM | POA: Diagnosis not present

## 2020-11-26 DIAGNOSIS — D225 Melanocytic nevi of trunk: Secondary | ICD-10-CM | POA: Diagnosis not present

## 2020-11-26 DIAGNOSIS — Z85820 Personal history of malignant melanoma of skin: Secondary | ICD-10-CM | POA: Diagnosis not present

## 2020-12-09 ENCOUNTER — Other Ambulatory Visit (HOSPITAL_COMMUNITY): Payer: Self-pay | Admitting: Family Medicine

## 2020-12-10 MED FILL — ZOLPIDEM TARTRATE 5 MG TAB: 5 | 30 days supply | Qty: 30 | Fill #0

## 2020-12-10 MED FILL — ATORVASTATIN 40 MG TABLET: 40 | 90 days supply | Qty: 90 | Fill #0

## 2020-12-21 DIAGNOSIS — K449 Diaphragmatic hernia without obstruction or gangrene: Secondary | ICD-10-CM | POA: Diagnosis not present

## 2020-12-21 DIAGNOSIS — K317 Polyp of stomach and duodenum: Secondary | ICD-10-CM | POA: Diagnosis not present

## 2020-12-21 DIAGNOSIS — R1310 Dysphagia, unspecified: Secondary | ICD-10-CM | POA: Diagnosis not present

## 2020-12-21 DIAGNOSIS — K3189 Other diseases of stomach and duodenum: Secondary | ICD-10-CM | POA: Diagnosis not present

## 2020-12-21 DIAGNOSIS — K222 Esophageal obstruction: Secondary | ICD-10-CM | POA: Diagnosis not present

## 2021-01-03 DIAGNOSIS — N890 Mild vaginal dysplasia: Secondary | ICD-10-CM | POA: Diagnosis not present

## 2021-01-03 DIAGNOSIS — Z01411 Encounter for gynecological examination (general) (routine) with abnormal findings: Secondary | ICD-10-CM | POA: Diagnosis not present

## 2021-01-03 DIAGNOSIS — R87612 Low grade squamous intraepithelial lesion on cytologic smear of cervix (LGSIL): Secondary | ICD-10-CM | POA: Diagnosis not present

## 2021-01-11 MED FILL — BENAZEPRIL HCL 20 MG TABLET: 20 | 90 days supply | Qty: 90 | Fill #2

## 2021-01-11 MED FILL — SYNTHROID 125 MCG TABLET: 125 | 90 days supply | Qty: 90 | Fill #1

## 2021-01-11 MED FILL — AMLODIPINE BESYLATE 5 MG TA: 5 | 90 days supply | Qty: 90 | Fill #2

## 2021-03-31 ENCOUNTER — Telehealth: Payer: Self-pay

## 2021-03-31 DIAGNOSIS — I5189 Other ill-defined heart diseases: Secondary | ICD-10-CM

## 2021-03-31 NOTE — Telephone Encounter (Signed)
Called to arrange 12 month echo and office visit with Dr. Burt Knack in November.  MyChart message sent to patient with options.

## 2021-04-01 NOTE — Telephone Encounter (Signed)
Scheduled patient for 1 year visit with Dr. Burt Knack 09/07/21. Scheduled echo 09/05/21. She was grateful for call and agrees with plan.

## 2021-04-11 ENCOUNTER — Other Ambulatory Visit (HOSPITAL_COMMUNITY): Payer: Self-pay

## 2021-04-11 MED FILL — Atorvastatin Calcium Tab 40 MG (Base Equivalent): ORAL | 90 days supply | Qty: 90 | Fill #0 | Status: AC

## 2021-04-11 MED FILL — Benazepril HCl Tab 20 MG: ORAL | 90 days supply | Qty: 90 | Fill #0 | Status: AC

## 2021-04-11 MED FILL — Amlodipine Besylate Tab 5 MG (Base Equivalent): ORAL | 90 days supply | Qty: 90 | Fill #0 | Status: AC

## 2021-04-11 MED FILL — Zolpidem Tartrate Tab 5 MG: ORAL | 30 days supply | Qty: 30 | Fill #0 | Status: AC

## 2021-04-11 MED FILL — Levothyroxine Sodium Tab 125 MCG: ORAL | 90 days supply | Qty: 90 | Fill #0 | Status: AC

## 2021-05-06 ENCOUNTER — Other Ambulatory Visit (HOSPITAL_COMMUNITY): Payer: Self-pay

## 2021-05-06 MED FILL — Omeprazole Cap Delayed Release 10 MG: ORAL | 90 days supply | Qty: 135 | Fill #0 | Status: AC

## 2021-07-18 ENCOUNTER — Other Ambulatory Visit (HOSPITAL_COMMUNITY): Payer: Self-pay

## 2021-07-18 MED ORDER — LEVOTHYROXINE SODIUM 125 MCG PO TABS
125.0000 ug | ORAL_TABLET | Freq: Every morning | ORAL | 0 refills | Status: DC
  Filled 2021-07-18: qty 90, 90d supply, fill #0

## 2021-07-18 MED ORDER — BENAZEPRIL HCL 20 MG PO TABS
20.0000 mg | ORAL_TABLET | Freq: Every day | ORAL | 0 refills | Status: DC
  Filled 2021-07-18: qty 90, 90d supply, fill #0

## 2021-07-18 MED ORDER — AMLODIPINE BESYLATE 5 MG PO TABS
5.0000 mg | ORAL_TABLET | Freq: Every day | ORAL | 0 refills | Status: DC
  Filled 2021-07-18: qty 90, 90d supply, fill #0

## 2021-08-29 DIAGNOSIS — Z0000 Encounter for general adult medical examination without abnormal findings: Secondary | ICD-10-CM | POA: Diagnosis not present

## 2021-08-29 DIAGNOSIS — E7800 Pure hypercholesterolemia, unspecified: Secondary | ICD-10-CM | POA: Diagnosis not present

## 2021-08-29 DIAGNOSIS — E039 Hypothyroidism, unspecified: Secondary | ICD-10-CM | POA: Diagnosis not present

## 2021-08-29 DIAGNOSIS — I10 Essential (primary) hypertension: Secondary | ICD-10-CM | POA: Diagnosis not present

## 2021-08-29 DIAGNOSIS — R7309 Other abnormal glucose: Secondary | ICD-10-CM | POA: Diagnosis not present

## 2021-09-05 ENCOUNTER — Ambulatory Visit (HOSPITAL_COMMUNITY): Payer: 59 | Attending: Internal Medicine

## 2021-09-05 ENCOUNTER — Other Ambulatory Visit: Payer: Self-pay

## 2021-09-05 DIAGNOSIS — E7800 Pure hypercholesterolemia, unspecified: Secondary | ICD-10-CM | POA: Diagnosis not present

## 2021-09-05 DIAGNOSIS — I10 Essential (primary) hypertension: Secondary | ICD-10-CM | POA: Diagnosis not present

## 2021-09-05 DIAGNOSIS — I350 Nonrheumatic aortic (valve) stenosis: Secondary | ICD-10-CM | POA: Diagnosis not present

## 2021-09-05 DIAGNOSIS — I5189 Other ill-defined heart diseases: Secondary | ICD-10-CM | POA: Diagnosis not present

## 2021-09-05 DIAGNOSIS — Z85820 Personal history of malignant melanoma of skin: Secondary | ICD-10-CM | POA: Diagnosis not present

## 2021-09-05 DIAGNOSIS — Z853 Personal history of malignant neoplasm of breast: Secondary | ICD-10-CM | POA: Diagnosis not present

## 2021-09-05 DIAGNOSIS — Z0000 Encounter for general adult medical examination without abnormal findings: Secondary | ICD-10-CM | POA: Diagnosis not present

## 2021-09-05 DIAGNOSIS — E559 Vitamin D deficiency, unspecified: Secondary | ICD-10-CM | POA: Diagnosis not present

## 2021-09-05 DIAGNOSIS — E039 Hypothyroidism, unspecified: Secondary | ICD-10-CM | POA: Diagnosis not present

## 2021-09-05 DIAGNOSIS — R7309 Other abnormal glucose: Secondary | ICD-10-CM | POA: Diagnosis not present

## 2021-09-05 LAB — ECHOCARDIOGRAM COMPLETE
AR max vel: 1.56 cm2
AV Area VTI: 1.49 cm2
AV Area mean vel: 1.48 cm2
AV Mean grad: 10 mmHg
AV Peak grad: 17.5 mmHg
Ao pk vel: 2.09 m/s
Area-P 1/2: 3.97 cm2
MV VTI: 1.58 cm2
S' Lateral: 2.4 cm

## 2021-09-07 ENCOUNTER — Ambulatory Visit: Payer: 59 | Admitting: Cardiovascular Disease

## 2021-09-07 ENCOUNTER — Encounter: Payer: Self-pay | Admitting: Cardiovascular Disease

## 2021-09-07 ENCOUNTER — Other Ambulatory Visit: Payer: Self-pay

## 2021-09-07 VITALS — BP 120/70 | HR 75 | Ht 62.5 in | Wt 129.2 lb

## 2021-09-07 DIAGNOSIS — E782 Mixed hyperlipidemia: Secondary | ICD-10-CM

## 2021-09-07 DIAGNOSIS — I342 Nonrheumatic mitral (valve) stenosis: Secondary | ICD-10-CM

## 2021-09-07 DIAGNOSIS — I10 Essential (primary) hypertension: Secondary | ICD-10-CM | POA: Diagnosis not present

## 2021-09-07 DIAGNOSIS — I350 Nonrheumatic aortic (valve) stenosis: Secondary | ICD-10-CM | POA: Diagnosis not present

## 2021-09-07 NOTE — Progress Notes (Signed)
Cardiology Office Note:    Date:  09/07/2021   ID:  Brooke Gomez, DOB [birth year], MRN [id number]  PCP:  [name], DO   [facility] Providers Cardiologist:  Sherren Mocha, MD     Referring MD: [name], DO   Chief Complaint  Patient presents with   Hypertension     History of Present Illness:    Brooke Gomez is a 50 y.o. female with a hx of Hodgkin's lymphoma treated with thoracotomy, chemotherapy, and radiation.  She has had aortic sclerosis and some thickening calcification noted of both her aortic and mitral valves.  She was last seen 1 year ago and returns today for follow-up evaluation.  She is here alone today.  She is done an excellent job of maintaining her weight loss through the [employer] program.  She feels well and exercises regularly with no exertional symptoms.  Patient denies chest pain, chest pressure, shortness of breath, heart palpitations, edema, orthopnea, PND, lightheadedness, or syncope.  She reports no recent changes in any of her medications.  She remains on amlodipine and benazepril for hypertension, atorvastatin for hyperlipidemia, and low-dose aspirin.  She recently had an echocardiogram which is reviewed today.  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   aspirin 81 MG tablet Take 81 mg by mouth  daily.    atorvastatin (LIPITOR) 40 MG tablet TAKE 1 TABLET BY MOUTH ONCE A DAY   benazepril (LOTENSIN) 20 MG tablet Take 20 mg by mouth daily.   Calcium Carbonate-Vitamin D (CALTRATE 600+D PO) Take 1 tablet by mouth 2 (two) times daily.   levothyroxine (SYNTHROID) 125 MCG tablet Take 125 mcg by mouth daily before breakfast.   Multiple Vitamins-Minerals (MULTIVITAMIN PO) Take 1 tablet by mouth daily.   Omega-3 Fatty Acids (FISH OIL) 1200 MG CAPS Take 1 capsule by mouth 2 (two) times daily.   omeprazole (PRILOSEC) 10 MG capsule Take 10 mg by mouth daily.   SYNTHROID 125 MCG tablet TAKE 1 TABLET BY MOUTH ONCE DAILY IN THE MORNING ON AN EMPTY STOMACH.   valACYclovir (VALTREX) 500 MG tablet Take 1,000 mg by mouth 2 (two) times daily as needed.     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   Smoking status: Never   Smokeless tobacco: Never  Substance and Sexual Activity   Alcohol use: Yes    Alcohol/week: 7.0 - 10.0 standard drinks    Types: 7 - 10 Glasses of wine per week    Comment: a glass of wine daily   Drug use: No   Sexual activity: Yes    Birth control/protection: Surgical  Other Topics Concern   Not on file  Social History Narrative   Not on file   Social Determinants of Health   Financial Resource Strain: Not on file  Food Insecurity: Not on file  Transportation Needs: Not on file  Physical Activity: Not on file  Stress: Not on file  Social Connections: Not on file     Family History: The patient's family history includes Breast cancer in her paternal aunt; Cancer (age of onset: 11) in her mother; Colon cancer in her paternal grandfather;  Hyperlipidemia in her father; Hypertension in her mother; Scleroderma (age of onset: 7) in her sister.  ROS:   Please see the history of present illness.    All other systems reviewed and are negative.  EKGs/Labs/Other Studies Reviewed:    The following studies were reviewed today: Echo 09/05/2021: IMPRESSIONS     1. Left ventricular ejection fraction, by estimation, is 60 to 65%. The  left ventricle has normal function. The left ventricle has no regional  wall motion abnormalities. There is mild left ventricular hypertrophy.  Left ventricular diastolic parameters  are consistent with Grade II diastolic dysfunction (pseudonormalization).  Elevated left ventricular end-diastolic pressure. The E/e' is 30.   2. Right ventricular systolic function is hyperdynamic. The right  ventricular size is normal. Tricuspid regurgitation signal is inadequate  for assessing PA pressure.   3. The mitral valve is abnormal. Trivial mitral valve regurgitation. Mild  mitral stenosis. The mean mitral valve gradient is 7.0 mmHg with average  heart rate of 91 bpm. Moderate mitral annular calcification.   4. The aortic valve was not well visualized. Aortic valve regurgitation  is not visualized. Mild to moderate aortic valve stenosis. Aortic valve  area, by VTI measures 1.49 cm. Aortic valve mean gradient measures 10.0  mmHg. Aortic valve Vmax measures  2.09 m/s. DI is 0.43.   5. The inferior vena cava is normal in size with greater than 50%  respiratory variability, suggesting right atrial pressure of 3 mmHg.   Comparison(s): Changes from prior study are noted. 07/28/2019: LVEF 60-65%,  grade 1 DD, mild to moderate AS - mean gradient 8 mmHg.   EKG:  EKG is ordered today.  The ekg ordered today demonstrates normal sinus rhythm 75 bpm, within normal limits.  Recent Labs: No results found for requested labs within last 8760 hours.  Recent Lipid Panel    Component Value Date/Time   CHOL 145 04/15/2012  1020   TRIG 108 04/15/2012 1020   TRIG 175 02/24/2009 0000   HDL 68 04/15/2012 1020   CHOLHDL 2.1 04/15/2012 1020   VLDL 22 04/15/2012 1020   LDLCALC 55 04/15/2012 1020     Risk Assessment/Calculations:           Physical Exam:    VS:  BP 120/70   Pulse 75   Ht 5' 2.5" (1.588 m)   Wt 129 lb 3.2 oz (58.6 kg)   SpO2 97%   BMI 23.25 kg/m     Wt Readings from Last 3 Encounters:  09/07/21 129 lb 3.2 oz (58.6 kg)  08/30/20 136 lb (61.7 kg)  08/11/19 153 lb 1.9 oz (69.5 kg)  GEN:  Well nourished, well developed in no acute distress HEENT: Normal NECK: No JVD; No carotid bruits LYMPHATICS: No lymphadenopathy CARDIAC: RRR, 2/6 early peaking systolic outflow murmur at the right upper sternal border RESPIRATORY:  Clear to auscultation without rales, wheezing or rhonchi  ABDOMEN: Soft, non-tender, non-distended MUSCULOSKELETAL:  No edema; No deformity  SKIN: Warm and dry NEUROLOGIC:  Alert and oriented x 3 PSYCHIATRIC:  Normal affect   ASSESSMENT:    1. Mild aortic stenosis   2. Nonrheumatic mitral valve stenosis   3. Essential hypertension   4. Mixed hyperlipidemia    PLAN:    In order of problems listed above:  The patient has mild degenerative changes of the aortic valve with mild aortic stenosis.  Her mean transvalvular gradient is 10 mmHg, previously 8 mmHg and 2020.  Recommend continued surveillance and clinical follow-up.  Likely repeat echo in 2 years.  Suspected valve changes related to radiation therapy for treatment of Hodgkin's lymphoma remotely. Mild mitral valve stenosis noted with a mean transmitral gradient of 6 mmHg.  Echo also demonstrates mild calcification and thickening of the mitral valve leaflets.  As above, we will repeat an echocardiogram in 2 years. Blood pressure is well controlled on amlodipine and benazepril.  We will continue current medical program.  Most recent labs reviewed.  Patient is maintaining a healthy lifestyle. She is forgetting  her p.m. atorvastatin dosing periodically.  She is going to work on taking this every day.  She has tolerated it well over time but notes that her LDL cholesterol increased from 49-83 on her most recent labs and she attributes this to missing her medication.  HDL cholesterol is excellent at 76.           Medication Adjustments/Labs and Tests Ordered: Current medicines are reviewed at length with the patient today.  Concerns regarding medicines are outlined above.  Orders Placed This Encounter  Procedures   EKG 12-Lead    No orders of the defined types were placed in this encounter.   Patient Instructions  Medication Instructions:  Your physician recommends that you continue on your current medications as directed. Please refer to the Current Medication list given to you today.  *If you need a refill on your cardiac medications before your next appointment, please call your pharmacy*  Lab Work: If you have labs (blood work) drawn today and your tests are completely normal, you will receive your results only by: [place] (if you have MyChart) OR A paper copy in the mail If you have any lab test that is abnormal or we need to change your treatment, we will call you to review the results.  Follow-Up: At [facility], you and your health needs are our priority.  As part of our continuing mission to provide you with exceptional heart care, we have created designated Provider Care Teams.  These Care Teams include your primary Cardiologist (physician) and Advanced Practice Providers (APPs -  Physician Assistants and Nurse Practitioners) who all work together to provide you with the care you need, when you need it.  We recommend signing up for the patient portal called "MyChart".  Sign up information is provided on this After Visit Summary.  MyChart is used to connect with patients for Virtual Visits (Telemedicine).  Patients are able to view lab/test results, encounter notes,  upcoming appointments, etc.  Non-urgent messages can be sent to your provider as well.   To learn more about what you can do with MyChart, go  to [email or web address].    Your next appointment:   1 year(s)  The format for your next appointment:   In Person  Provider:   Sherren Mocha, MD     Signed, Sherren Mocha, MD  09/07/2021 10:16 AM    [place]

## 2021-09-07 NOTE — Patient Instructions (Signed)
Medication Instructions:  Your physician recommends that you continue on your current medications as directed. Please refer to the Current Medication list given to you today.  *If you need a refill on your cardiac medications before your next appointment, please call your pharmacy*  Lab Work: If you have labs (blood work) drawn today and your tests are completely normal, you will receive your results only by: [place] (if you have MyChart) OR A paper copy in the mail If you have any lab test that is abnormal or we need to change your treatment, we will call you to review the results.  Follow-Up: At [facility], you and your health needs are our priority.  As part of our continuing mission to provide you with exceptional heart care, we have created designated Provider Care Teams.  These Care Teams include your primary Cardiologist (physician) and Advanced Practice Providers (APPs -  Physician Assistants and Nurse Practitioners) who all work together to provide you with the care you need, when you need it.  We recommend signing up for the patient portal called "MyChart".  Sign up information is provided on this After Visit Summary.  MyChart is used to connect with patients for Virtual Visits (Telemedicine).  Patients are able to view lab/test results, encounter notes, upcoming appointments, etc.  Non-urgent messages can be sent to your provider as well.   To learn more about what you can do with MyChart, go to [email or web address].    Your next appointment:   1 year(s)  The format for your next appointment:   In Person  Provider:   Sherren Mocha, MD

## 2021-09-12 DIAGNOSIS — H35033 Hypertensive retinopathy, bilateral: Secondary | ICD-10-CM | POA: Diagnosis not present

## 2021-09-12 DIAGNOSIS — H40013 Open angle with borderline findings, low risk, bilateral: Secondary | ICD-10-CM | POA: Diagnosis not present

## 2021-09-19 ENCOUNTER — Other Ambulatory Visit (HOSPITAL_COMMUNITY): Payer: Self-pay

## 2021-09-20 ENCOUNTER — Other Ambulatory Visit (HOSPITAL_COMMUNITY): Payer: Self-pay

## 2021-09-20 MED ORDER — OMEPRAZOLE 10 MG PO CPDR
10.0000 mg | DELAYED_RELEASE_CAPSULE | Freq: Two times a day (BID) | ORAL | 98 refills | Status: DC | PRN
  Filled 2021-09-20: qty 180, 90d supply, fill #0
  Filled 2022-03-08: qty 180, 90d supply, fill #1
  Filled 2022-07-13: qty 180, 90d supply, fill #2

## 2021-09-21 ENCOUNTER — Other Ambulatory Visit (HOSPITAL_COMMUNITY): Payer: Self-pay

## 2021-10-18 ENCOUNTER — Other Ambulatory Visit (HOSPITAL_COMMUNITY): Payer: Self-pay

## 2021-10-19 ENCOUNTER — Other Ambulatory Visit (HOSPITAL_COMMUNITY): Payer: Self-pay

## 2021-10-19 MED ORDER — LEVOTHYROXINE SODIUM 125 MCG PO TABS
125.0000 ug | ORAL_TABLET | Freq: Every morning | ORAL | 3 refills | Status: AC
  Filled 2021-10-19: qty 90, 90d supply, fill #0
  Filled 2022-01-15: qty 90, 90d supply, fill #1
  Filled 2022-04-16: qty 90, 90d supply, fill #2
  Filled 2022-07-13: qty 90, 90d supply, fill #3

## 2021-10-19 MED ORDER — AMLODIPINE BESYLATE 5 MG PO TABS
5.0000 mg | ORAL_TABLET | Freq: Every day | ORAL | 3 refills | Status: DC
  Filled 2021-10-19: qty 90, 90d supply, fill #0
  Filled 2022-01-15: qty 90, 90d supply, fill #1
  Filled 2022-04-16: qty 90, 90d supply, fill #2
  Filled 2022-07-13: qty 90, 90d supply, fill #3

## 2021-10-19 MED ORDER — BENAZEPRIL HCL 20 MG PO TABS
20.0000 mg | ORAL_TABLET | Freq: Every day | ORAL | 3 refills | Status: DC
  Filled 2021-10-19: qty 90, 90d supply, fill #0
  Filled 2022-01-15: qty 90, 90d supply, fill #1
  Filled 2022-04-16: qty 90, 90d supply, fill #2
  Filled 2022-07-13: qty 90, 90d supply, fill #3

## 2021-11-23 ENCOUNTER — Other Ambulatory Visit (HOSPITAL_COMMUNITY): Payer: Self-pay

## 2021-11-23 MED ORDER — ZOLPIDEM TARTRATE 5 MG PO TABS
5.0000 mg | ORAL_TABLET | Freq: Every evening | ORAL | 3 refills | Status: DC | PRN
Start: 2021-11-23 — End: 2023-01-18
  Filled 2021-11-23: qty 30, 30d supply, fill #0
  Filled 2022-01-15: qty 30, 30d supply, fill #1
  Filled 2022-04-16: qty 30, 30d supply, fill #2

## 2021-11-23 MED ORDER — ATORVASTATIN CALCIUM 40 MG PO TABS
40.0000 mg | ORAL_TABLET | Freq: Every day | ORAL | 2 refills | Status: DC
  Filled 2021-11-23: qty 90, 90d supply, fill #0
  Filled 2022-04-16: qty 90, 90d supply, fill #1
  Filled 2022-07-13: qty 90, 90d supply, fill #2

## 2021-11-25 ENCOUNTER — Other Ambulatory Visit (HOSPITAL_COMMUNITY): Payer: Self-pay

## 2021-12-15 ENCOUNTER — Other Ambulatory Visit (HOSPITAL_COMMUNITY): Payer: Self-pay

## 2022-01-16 ENCOUNTER — Other Ambulatory Visit (HOSPITAL_COMMUNITY): Payer: Self-pay

## 2022-01-26 ENCOUNTER — Other Ambulatory Visit (HOSPITAL_COMMUNITY): Payer: Self-pay

## 2022-01-26 MED ORDER — VALACYCLOVIR HCL 1 G PO TABS
2000.0000 mg | ORAL_TABLET | Freq: Two times a day (BID) | ORAL | 0 refills | Status: AC
Start: 2022-01-26 — End: ?
  Filled 2022-01-26: qty 90, 23d supply, fill #0

## 2022-01-26 MED ORDER — VALACYCLOVIR HCL 500 MG PO TABS
1000.0000 mg | ORAL_TABLET | Freq: Two times a day (BID) | ORAL | 0 refills | Status: DC
  Filled 2022-01-26: qty 90, 90d supply, fill #0

## 2022-03-08 ENCOUNTER — Other Ambulatory Visit (HOSPITAL_COMMUNITY): Payer: Self-pay

## 2022-04-13 DIAGNOSIS — K449 Diaphragmatic hernia without obstruction or gangrene: Secondary | ICD-10-CM | POA: Diagnosis not present

## 2022-04-13 DIAGNOSIS — K219 Gastro-esophageal reflux disease without esophagitis: Secondary | ICD-10-CM | POA: Diagnosis not present

## 2022-04-13 DIAGNOSIS — K317 Polyp of stomach and duodenum: Secondary | ICD-10-CM | POA: Diagnosis not present

## 2022-04-13 DIAGNOSIS — K222 Esophageal obstruction: Secondary | ICD-10-CM | POA: Diagnosis not present

## 2022-04-13 DIAGNOSIS — Z1211 Encounter for screening for malignant neoplasm of colon: Secondary | ICD-10-CM | POA: Diagnosis not present

## 2022-04-17 ENCOUNTER — Other Ambulatory Visit (HOSPITAL_COMMUNITY): Payer: Self-pay

## 2022-05-12 DIAGNOSIS — Z85820 Personal history of malignant melanoma of skin: Secondary | ICD-10-CM | POA: Diagnosis not present

## 2022-05-12 DIAGNOSIS — D225 Melanocytic nevi of trunk: Secondary | ICD-10-CM | POA: Diagnosis not present

## 2022-05-12 DIAGNOSIS — Z08 Encounter for follow-up examination after completed treatment for malignant neoplasm: Secondary | ICD-10-CM | POA: Diagnosis not present

## 2022-05-12 DIAGNOSIS — Z1283 Encounter for screening for malignant neoplasm of skin: Secondary | ICD-10-CM | POA: Diagnosis not present

## 2022-05-12 DIAGNOSIS — L820 Inflamed seborrheic keratosis: Secondary | ICD-10-CM | POA: Diagnosis not present

## 2022-05-22 DIAGNOSIS — N879 Dysplasia of cervix uteri, unspecified: Secondary | ICD-10-CM | POA: Diagnosis not present

## 2022-05-22 DIAGNOSIS — Z1272 Encounter for screening for malignant neoplasm of vagina: Secondary | ICD-10-CM | POA: Diagnosis not present

## 2022-07-13 ENCOUNTER — Other Ambulatory Visit (HOSPITAL_COMMUNITY): Payer: Self-pay

## 2022-09-01 DIAGNOSIS — E039 Hypothyroidism, unspecified: Secondary | ICD-10-CM | POA: Diagnosis not present

## 2022-09-01 DIAGNOSIS — E7800 Pure hypercholesterolemia, unspecified: Secondary | ICD-10-CM | POA: Diagnosis not present

## 2022-09-01 DIAGNOSIS — I10 Essential (primary) hypertension: Secondary | ICD-10-CM | POA: Diagnosis not present

## 2022-09-01 DIAGNOSIS — E559 Vitamin D deficiency, unspecified: Secondary | ICD-10-CM | POA: Diagnosis not present

## 2022-09-01 DIAGNOSIS — R7309 Other abnormal glucose: Secondary | ICD-10-CM | POA: Diagnosis not present

## 2022-09-04 ENCOUNTER — Ambulatory Visit: Payer: 59 | Attending: Cardiovascular Disease | Admitting: Cardiovascular Disease

## 2022-09-04 ENCOUNTER — Encounter: Payer: Self-pay | Admitting: Cardiovascular Disease

## 2022-09-04 VITALS — BP 134/76 | HR 68 | Ht 62.5 in | Wt 131.6 lb

## 2022-09-04 DIAGNOSIS — I050 Rheumatic mitral stenosis: Secondary | ICD-10-CM

## 2022-09-04 DIAGNOSIS — I350 Nonrheumatic aortic (valve) stenosis: Secondary | ICD-10-CM

## 2022-09-04 DIAGNOSIS — H40013 Open angle with borderline findings, low risk, bilateral: Secondary | ICD-10-CM | POA: Diagnosis not present

## 2022-09-04 DIAGNOSIS — I10 Essential (primary) hypertension: Secondary | ICD-10-CM

## 2022-09-04 DIAGNOSIS — E782 Mixed hyperlipidemia: Secondary | ICD-10-CM

## 2022-09-04 DIAGNOSIS — H35033 Hypertensive retinopathy, bilateral: Secondary | ICD-10-CM | POA: Diagnosis not present

## 2022-09-04 DIAGNOSIS — H524 Presbyopia: Secondary | ICD-10-CM | POA: Diagnosis not present

## 2022-09-04 NOTE — Progress Notes (Signed)
Cardiology Office Note:    Date:  09/04/2022   ID:  JOYCLYN Gomez, DOB 30-Mar-1971, MRN [id number]  PCP:  Brooke Gomez, [place] Providers Cardiologist:  Sherren Mocha, MD     Referring MD: Brooke Morning, DO   Chief Complaint  Patient presents with   Hypertension    History of Present Illness:    Brooke Gomez is a 51 y.o. female with a hx of Hodgkin's lymphoma treated with thoracotomy, chemotherapy, and radiation.  The patient has been followed for mild aortic stenosis and mild mitral stenosis thought to be related to her prior radiation therapy.  She also has hypertension and mixed hyperlipidemia.   The patient is here alone today.  She brings in recent lab work today with a cholesterol of 134, triglycerides 76, HDL 62, LDL 57 creatinine is 0.87, sodium 142, potassium 4.5 TSH, TSH 0.56 blood counts are normal with a hemoglobin of 14.3 platelet count 409,000, white blood cell count 8.6 hemoglobin A1c 5.7.  From a symptomatic perspective, she is doing very well.  She has occasional chest discomfort at rest but does not feel like this is related to her heart.  She has no exertional chest pain or pressure.  She is compliant with her medications.  She does complain of some lower leg muscle aching at night and when she first wakes up in the Gomez.  She wonders if this could be related to her statin drug.  She feels like it is tolerable and would like to continue on her alternating doses of atorvastatin 40 with 20 mg every other day.  She denies shortness of breath, heart palpitations, edema, orthopnea, or PND.  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, hx   Hodgkin's disease    personal history   Hyperlipemia    Melanoma ([place])    Schatzki's ring    Unspecified essential hypertension    Unspecified hypothyroidism     Past Surgical History:  Procedure Laterality Date   APPENDECTOMY     ESOPHAGEAL DILATION      Multiple times for Schatzki's Ring   MASTECTOMY     bilateral   NOVASURE ABLATION     several lymph node biopies     SPLENECTOMY     THORACOTOMY     THYROIDECTOMY     TONSILLECTOMY     TUBAL LIGATION      Current Medications: Current Meds  Medication Sig   amLODipine (NORVASC) 5 MG tablet Take 1 tablet (5 mg total) by mouth daily.   aspirin 81 MG tablet Take 81 mg by mouth daily.    atorvastatin (LIPITOR) 40 MG tablet Take 1 tablet (40 mg total) by mouth daily.   benazepril (LOTENSIN) 20 MG tablet Take 1 tablet (20 mg total) by mouth daily.   Calcium Carbonate-Vitamin D (CALTRATE 600+D PO) Take 1 tablet by mouth 2 (two) times daily.   levothyroxine (SYNTHROID) 125 MCG tablet Take 1 tablet (125 mcg total) by mouth in the Gomez on an empty stomach   Multiple Vitamins-Minerals (MULTIVITAMIN PO) Take 1 tablet by mouth daily.   Omega-3 Fatty Acids (FISH OIL) 1200 MG CAPS Take 1 capsule by mouth 2 (two) times daily.   omeprazole (PRILOSEC) 10 MG capsule Take 1 capsule (10 mg total) by mouth 2 (two) times daily as needed.   valACYclovir (VALTREX) 1000 MG tablet Take 2 tablets (2,000 mg total) by mouth every 12 (twelve) hours  for 1 day as needed   zolpidem (AMBIEN) 5 MG tablet Take 1 tablet (5 mg total) by mouth at bedtime as needed.     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   Smoking status: Never   Smokeless tobacco: Never  Substance and Sexual Activity   Alcohol use: Yes    Alcohol/week: 7.0 - 10.0 standard drinks of alcohol    Types: 7 - 10 Glasses of wine per week    Comment: a glass of wine daily   Drug use: No   Sexual activity: Yes     Birth control/protection: Surgical  Other Topics Concern   Not on file  Social History Narrative   Not on file   Social Determinants of Health   Financial Resource Strain: Not on file  Food Insecurity: Not on file  Transportation Needs: Not on file  Physical Activity: Not on file  Stress: Not on file  Social Connections: Not on file     Family History: The patient's family history includes Breast cancer in her paternal aunt; Cancer (age of onset: 72) in her mother; Colon cancer in her paternal grandfather; Hyperlipidemia in her father; Hypertension in her mother; Scleroderma (age of onset: 9) in her sister.  ROS:   Please see the history of present illness.    All other systems reviewed and are negative.  EKGs/Labs/Other Studies Reviewed:    The following studies were reviewed today: 2D Echo 09/05/2021: 1. Left ventricular ejection fraction, by estimation, is 60 to 65%. The  left ventricle has normal function. The left ventricle has no regional  wall motion abnormalities. There is mild left ventricular hypertrophy.  Left ventricular diastolic parameters  are consistent with Grade II diastolic dysfunction (pseudonormalization).  Elevated left ventricular end-diastolic pressure. The E/e' is 62.   2. Right ventricular systolic function is hyperdynamic. The right  ventricular size is normal. Tricuspid regurgitation signal is inadequate  for assessing PA pressure.   3. The mitral valve is abnormal. Trivial mitral valve regurgitation. Mild  mitral stenosis. The mean mitral valve gradient is 7.0 mmHg with average  heart rate of 91 bpm. Moderate mitral annular calcification.   4. The aortic valve was not well visualized. Aortic valve regurgitation  is not visualized. Mild to moderate aortic valve stenosis. Aortic valve  area, by VTI measures 1.49 cm. Aortic valve mean gradient measures 10.0  mmHg. Aortic valve Vmax measures  2.09 m/s. DI is 0.43.   5. The inferior vena cava is  normal in size with greater than 50%  respiratory variability, suggesting right atrial pressure of 3 mmHg.   Comparison(s): Changes from prior study are noted. 07/28/2019: LVEF 60-65%,  grade 1 DD, mild to moderate AS - mean gradient 8 mmHg.   EKG:  EKG is ordered today.  The ekg ordered today demonstrates normal sinus rhythm 68 bpm, sinus arrhythmia, possible left atrial enlargement.  Recent Labs: No results found for requested labs within last 365 days.  Recent Lipid Panel    Component Value Date/Time   CHOL 145 04/15/2012 1020   TRIG 108 04/15/2012 1020   TRIG 175 02/24/2009 0000  HDL 68 04/15/2012 1020   CHOLHDL 2.1 04/15/2012 1020   VLDL 22 04/15/2012 1020   LDLCALC 55 04/15/2012 1020     Risk Assessment/Calculations:                Physical Exam:    VS:  BP 134/76   Pulse 68   Ht 5' 2.5" (1.588 m)   Wt 131 lb 9.6 oz (59.7 kg)   SpO2 98%   BMI 23.69 kg/m     Wt Readings from Last 3 Encounters:  09/04/22 131 lb 9.6 oz (59.7 kg)  09/07/21 129 lb 3.2 oz (58.6 kg)  08/30/20 136 lb (61.7 kg)     GEN:  Well nourished, well developed in no acute distress HEENT: Normal NECK: No JVD; No carotid bruits LYMPHATICS: No lymphadenopathy CARDIAC: RRR, 2/6 harsh early peaking crescendo decrescendo murmur at the right upper sternal border RESPIRATORY:  Clear to auscultation without rales, wheezing or rhonchi  ABDOMEN: Soft, non-tender, non-distended MUSCULOSKELETAL:  No edema; No deformity  SKIN: Warm and dry NEUROLOGIC:  Alert and oriented x 3 PSYCHIATRIC:  Normal affect   ASSESSMENT:    1. Mild aortic stenosis   2. Essential hypertension   3. Mixed hyperlipidemia   4. Mild mitral stenosis    PLAN:    In order of problems listed above:  Likely related to mediastinal radiation.  Continue echo surveillance.  Most recent echo from last year reviewed as above.  Will follow a 25-monthecho prior to her office visit next year.  No change in her physical exam which  is suggestive of mild aortic stenosis Blood pressure is well controlled on amlodipine 5 mg daily and benazepril 20 mg daily. Treated with atorvastatin 40 mg alternating with 20 mg every other day.  Lipids as above with recent LDL cholesterol 57 mg/dL. Last echo reviewed as above.  Plan surveillance with a repeat echocardiogram next year.  Overall the patient is doing very well.  She takes excellent care of herself and she is active with exercise and follows a very good diet.  She has maintained her weight loss now over a few years and continues to do great.           Medication Adjustments/Labs and Tests Ordered: Current medicines are reviewed at length with the patient today.  Concerns regarding medicines are outlined above.  Orders Placed This Encounter  Procedures   ECHOCARDIOGRAM COMPLETE   No orders of the defined types were placed in this encounter.   Patient Instructions  Medication Instructions:  Your physician recommends that you continue on your current medications as directed. Please refer to the Current Medication list given to you today.  *If you need a refill on your cardiac medications before your next appointment, please call your pharmacy*   Lab Work: NONE If you have labs (blood work) drawn today and your tests are completely normal, you will receive your results only by: [place](if you have MyChart) OR A paper copy in the mail If you have any lab test that is abnormal or we need to change your treatment, we will call you to review the results.   Testing/Procedures: ECHO (prior to visit in 1 year) Your physician has requested that you have an echocardiogram. Echocardiography is a painless test that uses sound waves to create images of your heart. It provides your doctor with information about the size and shape of your heart and how well your heart's chambers and valves are working. This procedure  takes approximately one hour. There are no restrictions  for this procedure. Please do NOT wear cologne, perfume, aftershave, or lotions (deodorant is allowed). Please arrive 15 minutes prior to your appointment time.  Follow-Up: At [facility], you and your health needs are our priority.  As part of our continuing mission to provide you with exceptional heart care, we have created designated Provider Care Teams.  These Care Teams include your primary Cardiologist (physician) and Advanced Practice Providers (APPs -  Physician Assistants and Nurse Practitioners) who all work together to provide you with the care you need, when you need it.  Your next appointment:   1 year(s)  The format for your next appointment:   In Person  Provider:   Sherren Mocha, MD       Important Information About Sugar         Signed, Sherren Mocha, MD  09/04/2022 2:23 PM    [place]

## 2022-09-04 NOTE — Patient Instructions (Signed)
Medication Instructions:  Your physician recommends that you continue on your current medications as directed. Please refer to the Current Medication list given to you today.  *If you need a refill on your cardiac medications before your next appointment, please call your pharmacy*   Lab Work: NONE If you have labs (blood work) drawn today and your tests are completely normal, you will receive your results only by: [place] (if you have MyChart) OR A paper copy in the mail If you have any lab test that is abnormal or we need to change your treatment, we will call you to review the results.   Testing/Procedures: ECHO (prior to visit in 1 year) Your physician has requested that you have an echocardiogram. Echocardiography is a painless test that uses sound waves to create images of your heart. It provides your doctor with information about the size and shape of your heart and how well your heart's chambers and valves are working. This procedure takes approximately one hour. There are no restrictions for this procedure. Please do NOT wear cologne, perfume, aftershave, or lotions (deodorant is allowed). Please arrive 15 minutes prior to your appointment time.  Follow-Up: At [facility], you and your health needs are our priority.  As part of our continuing mission to provide you with exceptional heart care, we have created designated Provider Care Teams.  These Care Teams include your primary Cardiologist (physician) and Advanced Practice Providers (APPs -  Physician Assistants and Nurse Practitioners) who all work together to provide you with the care you need, when you need it.  Your next appointment:   1 year(s)  The format for your next appointment:   In Person  Provider:   Sherren Mocha, MD       Important Information About Sugar

## 2022-09-05 NOTE — Addendum Note (Signed)
Addended by: Janan Halter F on: 09/05/2022 08:05 AM   Modules accepted: Orders

## 2022-09-11 ENCOUNTER — Other Ambulatory Visit (HOSPITAL_COMMUNITY): Payer: Self-pay

## 2022-09-11 DIAGNOSIS — I350 Nonrheumatic aortic (valve) stenosis: Secondary | ICD-10-CM | POA: Diagnosis not present

## 2022-09-11 DIAGNOSIS — N951 Menopausal and female climacteric states: Secondary | ICD-10-CM | POA: Diagnosis not present

## 2022-09-11 DIAGNOSIS — Z23 Encounter for immunization: Secondary | ICD-10-CM | POA: Diagnosis not present

## 2022-09-11 DIAGNOSIS — Z0000 Encounter for general adult medical examination without abnormal findings: Secondary | ICD-10-CM | POA: Diagnosis not present

## 2022-09-11 DIAGNOSIS — E7800 Pure hypercholesterolemia, unspecified: Secondary | ICD-10-CM | POA: Diagnosis not present

## 2022-09-11 DIAGNOSIS — E890 Postprocedural hypothyroidism: Secondary | ICD-10-CM | POA: Diagnosis not present

## 2022-09-11 DIAGNOSIS — H6120 Impacted cerumen, unspecified ear: Secondary | ICD-10-CM | POA: Diagnosis not present

## 2022-09-11 DIAGNOSIS — Q8901 Asplenia (congenital): Secondary | ICD-10-CM | POA: Diagnosis not present

## 2022-09-11 MED ORDER — ROSUVASTATIN CALCIUM 10 MG PO TABS
10.0000 mg | ORAL_TABLET | Freq: Every day | ORAL | 3 refills | Status: DC
  Filled 2022-09-11: qty 90, 90d supply, fill #0
  Filled 2023-01-14: qty 90, 90d supply, fill #1
  Filled 2023-04-16: qty 90, 90d supply, fill #2
  Filled 2023-07-16: qty 90, 90d supply, fill #3

## 2022-09-12 ENCOUNTER — Other Ambulatory Visit (HOSPITAL_COMMUNITY): Payer: Self-pay

## 2022-09-18 ENCOUNTER — Other Ambulatory Visit (HOSPITAL_COMMUNITY): Payer: Self-pay

## 2022-09-21 ENCOUNTER — Telehealth: Payer: Self-pay | Admitting: Gastroenterology

## 2022-09-21 NOTE — Telephone Encounter (Signed)
Patient called to schedule an appointment with Dr. Rush Landmark no referral on file. In Epic she has recent GI hx with Dr. Liliane Channel office. Stated she had her last colonoscopy about a year ago, requested records that are not available in Epic for Dr. Rush Landmark to review and advise on scheduling. Patients stated "that was odd but ok".

## 2022-10-06 NOTE — Telephone Encounter (Signed)
I received a progress note from December 2019 from Dr. Earlean Shawl.  There is unfortunately no EGDs or colonoscopies for me to be able to review. We need to get the actual reports of the procedures and all the pathology because it looks like she has had polyps that have had dysplasia so we need to understand exactly what is going on if we are going to be able to take care of this particular patient. She can be set up for a clinic visit in the coming year but we must have all these records for me to be able to help her adequately. Thanks. GM

## 2022-10-09 NOTE — Telephone Encounter (Signed)
Brooke Gomez, please see note from Dr. Rush Landmark. Patient may need to contact previous GI office to obtain records directly. They will not send [place] records without patient's consent.

## 2022-10-17 ENCOUNTER — Other Ambulatory Visit (HOSPITAL_COMMUNITY): Payer: Self-pay

## 2022-10-17 NOTE — Telephone Encounter (Signed)
Called patient to advise left voicemail. °

## 2022-10-19 ENCOUNTER — Other Ambulatory Visit (HOSPITAL_COMMUNITY): Payer: Self-pay

## 2022-10-19 MED ORDER — BENAZEPRIL HCL 20 MG PO TABS
20.0000 mg | ORAL_TABLET | Freq: Every day | ORAL | 3 refills | Status: DC
  Filled 2022-10-19: qty 90, 90d supply, fill #0
  Filled 2023-01-14: qty 90, 90d supply, fill #1
  Filled 2023-04-16: qty 90, 90d supply, fill #2
  Filled 2023-07-16: qty 90, 90d supply, fill #3

## 2022-10-19 MED ORDER — AMLODIPINE BESYLATE 5 MG PO TABS
5.0000 mg | ORAL_TABLET | Freq: Every day | ORAL | 3 refills | Status: DC
  Filled 2022-10-19: qty 90, 90d supply, fill #0
  Filled 2023-01-14: qty 90, 90d supply, fill #1
  Filled 2023-04-16: qty 90, 90d supply, fill #2
  Filled 2023-07-16: qty 90, 90d supply, fill #3

## 2022-10-24 NOTE — Telephone Encounter (Signed)
Patient called back, verbalized understanding, will call previous GI office to obtain more records. I have scheduled her appointment for 3/28. Patient wanted to establish an appointment and was okay with appointment being postponed if records were not received by then.

## 2022-11-06 NOTE — Telephone Encounter (Signed)
Thank you Rovonda for your help getting these records. I will review them when I return to clinic. We will make any further recommendations from there. She can keep the currently scheduled clinic visit. GM

## 2022-11-06 NOTE — Telephone Encounter (Signed)
Records recd from Dr. Earlean Shawl. Records have been placed in your office in-box. Patient has been now been scheduled for 12/20/22 at 2:30 pm.

## 2022-11-07 NOTE — Telephone Encounter (Signed)
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 Duodenal postbulbar submucosal mass benign in appearance and noted to be unchanged from prior exam  Pathology Fundic gland polyps 1 with at least low-grade dysplasia cannot rule out high-grade dysplasia   January 2020 EGD Gastric polyps approximately 36 polyps size 1 to 2 cm appearance fundic gland polyp Postbulbar submucosal lesion, probable lipoma approximately 2 cm Referral for possible EUS  Pathology Fundic gland polyps negative for dysplasia   December 2019 EGD Gastric polyps Stomach fundus/stomach body approximately 100 1 to 2 cm each Fundic gland polyps Hiatal hernia approximately 37 to 39 cm containing possible polyp versus fold at EG junction  2019 pathology 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   August 2019 EGD Hiatal hernia, 3 cm from 38 to 41 cm, noninflamed Schatzki's ring thin, benign, noninflamed, benign, narrowed Gastric polyps fundus 1 cm frond-like biopsied Fundic gland polyps large numerous Duodenal bulb submucosal mass approximate 1 cm biopsy Savory dilation Consider possible EUS  Pathology Duodenal bulb biopsy is polypoid fragment of mature adipose Brunner's gland otherwise no diagnostic abnormality and no malignancy identified Gastric fundus polypectomy is a fundic gland polyp with hemorrhage  August 2019 colonoscopy No neoplasia Internal hemorrhoids small Normal ileoscopy examination Repeat colonoscopy in 5 years recommended due to family history of colon cancer    I am happy to take over this patient's care based on what I have reviewed. In brief she has a history of dysplastic fundic gland polyps (seems to have a lot of them) without a clear diagnosis of FAP.   As well she has a family history of colon cancer and is  due for repeat colonoscopy in 2024 for 5-year follow-up from her previous in 2019.   It is not clear to me if she is ever had an endoscopic ultrasound, if she has only had her procedures done through Dr. Earlean Shawl, then she has never had an EUS.  I am willing to move forward with meeting the patient in clinic and then determining any procedures that may be required this year including surveillance endoscopy +/- need for EUS and also doing her colon cancer screening.  If she has had an EUS performed then we need to try to find that particular report most likely would have had to have been done in [place].  If she wants to wait until the clinic visit that is scheduled that is fine since she is due later this year for 5-year follow-up colon.  Please let me know what she decides.  In the interim until she is seen in our office, if she has other issues she will need to see her PCP.   Justice Britain, MD [facility] Office # [phone]

## 2022-12-20 ENCOUNTER — Ambulatory Visit: Payer: Commercial Managed Care - PPO | Admitting: Gastroenterology

## 2023-01-10 ENCOUNTER — Ambulatory Visit: Payer: Commercial Managed Care - PPO | Admitting: Gastroenterology

## 2023-01-14 ENCOUNTER — Other Ambulatory Visit (HOSPITAL_COMMUNITY): Payer: Self-pay

## 2023-01-15 ENCOUNTER — Other Ambulatory Visit (HOSPITAL_COMMUNITY): Payer: Self-pay

## 2023-01-15 MED ORDER — OMEPRAZOLE 10 MG PO CPDR
10.0000 mg | DELAYED_RELEASE_CAPSULE | Freq: Two times a day (BID) | ORAL | 99 refills | Status: DC
  Filled 2023-01-15: qty 180, 90d supply, fill #0

## 2023-01-16 ENCOUNTER — Other Ambulatory Visit: Payer: Self-pay

## 2023-01-18 ENCOUNTER — Ambulatory Visit: Payer: Commercial Managed Care - PPO | Admitting: Gastroenterology

## 2023-01-18 ENCOUNTER — Other Ambulatory Visit (HOSPITAL_COMMUNITY): Payer: Self-pay

## 2023-01-18 MED ORDER — ZOLPIDEM TARTRATE 5 MG PO TABS
5.0000 mg | ORAL_TABLET | Freq: Every day | ORAL | 3 refills | Status: AC
  Filled 2023-01-18: qty 30, 30d supply, fill #0
  Filled 2023-04-16: qty 30, 30d supply, fill #1

## 2023-01-23 ENCOUNTER — Ambulatory Visit: Payer: Commercial Managed Care - PPO | Admitting: Gastroenterology

## 2023-01-23 ENCOUNTER — Encounter: Payer: Self-pay | Admitting: Gastroenterology

## 2023-01-23 VITALS — BP 122/80 | HR 106 | Ht 62.5 in | Wt 139.0 lb

## 2023-01-23 DIAGNOSIS — R1319 Other dysphagia: Secondary | ICD-10-CM

## 2023-01-23 DIAGNOSIS — K449 Diaphragmatic hernia without obstruction or gangrene: Secondary | ICD-10-CM

## 2023-01-23 DIAGNOSIS — K222 Esophageal obstruction: Secondary | ICD-10-CM

## 2023-01-23 DIAGNOSIS — Z800 Family history of malignant neoplasm of digestive organs: Secondary | ICD-10-CM | POA: Diagnosis not present

## 2023-01-23 DIAGNOSIS — R1310 Dysphagia, unspecified: Secondary | ICD-10-CM

## 2023-01-23 DIAGNOSIS — K219 Gastro-esophageal reflux disease without esophagitis: Secondary | ICD-10-CM | POA: Diagnosis not present

## 2023-01-23 DIAGNOSIS — K3189 Other diseases of stomach and duodenum: Secondary | ICD-10-CM | POA: Diagnosis not present

## 2023-01-23 NOTE — H&P (View-Only) (Signed)
 GASTROENTEROLOGY OUTPATIENT CLINIC VISIT   Primary Care Provider Collins, Dana, DO [address] [place] [address] [phone]  Referring Provider Collins, Dana, DO [address] [place],   [id number] [phone]  Patient Profile: Brooke Gomez is a 52 y.o. female with a pmh significant for prior Hodgkin's lymphoma, prior breast cancer, hypertension, hypothyroidism, hyperlipidemia, Schatzki ring, duodenal SEL, family history colon cancer.  The patient presents to the [facility] for an evaluation and management of problem(s) noted below:  Problem List 1. Esophageal dysphagia   2. Schatzki's ring   3. Gastroesophageal reflux disease without esophagitis   4. Hiatal hernia   5. Duodenal nodule   6. Family history of colon cancer     History of Present Illness This is the patient's first visit to the outpatient [facility].  She previously is followed with Dr. Medoff years but he has since retired.  She has a history of recurrent esophageal dysphagia symptoms.  She has had a Schatzki ring that has been dilated on multiple occasions.  She is also had a known duodenal subepithelial lesion that has not undergone EUS discussed in the past.  Personal history of Hodgkin's lymphoma breast cancer, and family history of colon cancer, she has been undergoing colonoscopies every 5 years.  She is starting to have symptoms of solid food dysphagia and thinks it is time for repeat dilation.  She has not had any other GI symptoms occur.    GI Review of Systems Positive as above Negative for odynophagia, nausea, vomiting, melena, medic easier, change in bowel habits  Review of Systems General: Denies fevers/chills/weight loss unintentionally Cardiovascular: Denies chest pain Pulmonary: Denies shortness of breath Gastroenterological: See HPI Genitourinary: Denies darkened urine Hematological: Denies easy  bruising/bleeding Dermatological: Denies jaundice Psychological: Mood is stable   Medications Current Outpatient Medications  Medication Sig Dispense Refill   amLODipine (NORVASC) 5 MG tablet Take 1 tablet (5 mg total) by mouth daily. 90 tablet 3   aspirin 81 MG tablet Take 81 mg by mouth daily.      benazepril (LOTENSIN) 20 MG tablet Take 1 tablet (20 mg total) by mouth daily. 90 tablet 3   Calcium Carbonate-Vitamin D (CALTRATE 600+D PO) Take 1 tablet by mouth 2 (two) times daily.     levothyroxine (SYNTHROID) 125 MCG tablet Take 1 tablet (125 mcg total) by mouth in the morning on an empty stomach 90 tablet 3   Multiple Vitamins-Minerals (MULTIVITAMIN PO) Take 1 tablet by mouth daily.     NON FORMULARY Pt taking Black cohosh     Omega-3 Fatty Acids (FISH OIL) 1200 MG CAPS Take 1 capsule by mouth 2 (two) times daily.     omeprazole (PRILOSEC) 10 MG capsule Take 1 capsule (10 mg total) by mouth 2 (two) times daily as needed 180 capsule 99   rosuvastatin (CRESTOR) 10 MG tablet Take 1 tablet (10 mg total) by mouth daily. 90 tablet 3   valACYclovir (VALTREX) 1000 MG tablet Take 2 tablets (2,000 mg total) by mouth every 12 (twelve) hours for 1 day as needed 90 tablet 0   zolpidem (AMBIEN) 5 MG tablet Take 1 tablet (5 mg total) by mouth at bedtime as needed. 30 tablet 3   No current facility-administered medications for this visit.    Allergies Allergies  Allergen Reactions   Penicillins Anaphylaxis   Cephalosporins     Rash   Chlorhexidine Gluconate     Rash       Histories Past Medical History:  Diagnosis Date   Adenocarcinoma of breast    bilateral, hx   Hodgkin's disease    personal history   Hyperlipemia    Melanoma    Schatzki's ring    Unspecified essential hypertension    Unspecified hypothyroidism    Past Surgical History:  Procedure Laterality Date   APPENDECTOMY     ESOPHAGEAL DILATION     Multiple times for Schatzki's Ring   MASTECTOMY     bilateral    NOVASURE ABLATION     several lymph node biopies     SPLENECTOMY     THORACOTOMY     THYROIDECTOMY     TONSILLECTOMY     TUBAL LIGATION     Social History   Socioeconomic History   Marital status: Married    Spouse name: Not on file   Number of children: 0   Years of education: Not on file   Highest education level: Not on file  Occupational History   Occupation: PA    Comment: Cardiothoracic Surgery at [employer]   Occupation: PP-C  Tobacco Use   Smoking status: Never   Smokeless tobacco: Never  Vaping Use   Vaping Use: Not on file  Substance and Sexual Activity   Alcohol use: Yes    Alcohol/week: 7.0 - 10.0 standard drinks of alcohol    Types: 7 - 10 Glasses of wine per week    Comment: a glass of wine daily   Drug use: No   Sexual activity: Yes    Birth control/protection: Surgical  Other Topics Concern   Not on file  Social History Narrative   Not on file   Social Determinants of Health   Financial Resource Strain: Not on file  Food Insecurity: Not on file  Transportation Needs: Not on file  Physical Activity: Not on file  Stress: Not on file  Social Connections: Not on file  Intimate Partner Violence: Not on file   Family History  Problem Relation Age of Onset   Hypertension Mother    Cancer Mother 60       Breast cancer   Hyperlipidemia Father    Scleroderma Sister 30       Esophageal   Breast cancer Paternal Aunt        2 paternal aunts had breast cancer in their 60s   Colon cancer Paternal Grandfather    Stomach cancer Neg Hx    Esophageal cancer Neg Hx    Inflammatory bowel disease Neg Hx    Liver disease Neg Hx    Rectal cancer Neg Hx    Pancreatic cancer Neg Hx    I have reviewed her medical, social, and family history in detail and updated the electronic medical record as necessary.    PHYSICAL EXAMINATION  BP 122/80   Pulse (!) 106   Ht 5' 2.5" (1.588 m)   Wt 139 lb (63 kg)   BMI 25.02 kg/m  Wt Readings from Last 3 Encounters:   01/23/23 139 lb (63 kg)  09/04/22 131 lb 9.6 oz (59.7 kg)  09/07/21 129 lb 3.2 oz (58.6 kg)  GEN: NAD, appears stated age, doesn't appear chronically ill PSYCH: Cooperative, without pressured speech EYE: Conjunctivae pink, sclerae anicteric ENT: MMM CV: Nontachycardic RESP: No audible wheezing GI: NABS, soft, NT/ND, without rebound or guarding MSK/EXT: No significant lower extremity edema SKIN: No jaundice NEURO:  Alert & Oriented x 3, no focal deficits   REVIEW OF DATA  I reviewed   the following data at the time of this encounter:  GI Procedures and Studies  June 2022 colonoscopy Patient shows me pictures (uploaded to media tab) and says everything was normal and she was told to follow-up in 5 years due to her personal history of cancer and family history as well  March 2022 EGD Patient shows me pictures (uploaded to media tab) I see the duodenal subepithelial lesion/nodule I see a Schatzki ring and hiatal hernia with what appears to be an irregular Z-line at least as well.  She says she was dilated  January 2021 EGD Distal esophageal stricture-benign, noninflamed Savory esophageal dilation 15/16/17 Hiatal hernia-small, noninflamed Gastric fundic gland polyps-it does not resected Duodenal postbulbar submucosal mass benign in appearance and noted to be unchanged from prior exam Pathology Fundic gland polyps 1 with at least low-grade dysplasia cannot rule out high-grade dysplasia   January 2020 EGD Gastric polyps approximately 36 polyps size 1 to 2 cm appearance fundic gland polyp Postbulbar submucosal lesion, probable lipoma approximately 2 cm Referral for possible EUS Pathology Fundic gland polyps negative for dysplasia   December 2019 EGD Gastric polyps Stomach fundus/stomach body approximately 100 1 to 2 cm each Fundic gland polyps Hiatal hernia approximately 37 to 39 cm containing possible polyp versus fold at EG junction   2019 pathology Fundic gland polyps  with focal low-grade dysplasia   August 2019 EGD Hiatal hernia, 3 cm from 38 to 41 cm, noninflamed Schatzki's ring thin, benign, noninflamed, benign, narrowed Gastric polyps fundus 1 cm frond-like biopsied Fundic gland polyps large numerous Duodenal bulb submucosal mass approximate 1 cm biopsy Savory dilation Consider possible EUS   Pathology Duodenal bulb biopsy is polypoid fragment of mature adipose Brunner's gland otherwise no diagnostic abnormality and no malignancy identified Gastric fundus polypectomy is a fundic gland polyp with hemorrhage   August 2019 colonoscopy No neoplasia Internal hemorrhoids small Normal ileoscopy examination Repeat colonoscopy in 5 years recommended due to family history of colon cancer   Laboratory Studies  Reviewed those in epic  Imaging Studies  No relevant studies to review   ASSESSMENT  Ms. Leighton is a 52 y.o. female with a pmh significant for prior Hodgkin's lymphoma, prior breast cancer, hypertension, hypothyroidism, hyperlipidemia, Schatzki ring, duodenal SEL, family history colon cancer.  The patient is seen today for evaluation and management of:  1. Esophageal dysphagia   2. Schatzki's ring   3. Gastroesophageal reflux disease without esophagitis   4. Hiatal hernia   5. Duodenal nodule   6. Family history of colon cancer    The patient is hemodynamically stable.  Clinically she is having recurring dysphagia symptoms but has had repeat dilations over the years which have been somewhat effective.  Is not clear to me that she has ever had Schatzki ring disruption biopsies performed but we would consider doing that at follow-up.  As well the patient has a duodenal subepithelial lesion that looks like it could be a carcinoid.  She has never undergone EUS.  Makes sense for us to go ahead and try to do that at the same time.  She is concerned about the risk of EMR so only wants to have this evaluated via EUS and not removed unless we find that  it turns out to be something on biopsies.  I told her that sometimes biopsies can still miss a deeper lesion but for now she wants to at least have the EUS.  The risks and benefits of endoscopic evaluation were discussed with the patient;   these include but are not limited to the risk of perforation, infection, bleeding, missed lesions, lack of diagnosis, severe illness requiring hospitalization, as well as anesthesia and sedation related illnesses.  The patient and/or family is agreeable to proceed.  The risks of an EUS including intestinal perforation, bleeding, infection, aspiration, and medication effects were discussed as was the possibility it may not give a definitive diagnosis if a biopsy is performed.  When a biopsy of the pancreas is done as part of the EUS, there is an additional risk of pancreatitis at the rate of about 1-2%.  It was explained that procedure related pancreatitis is typically mild, although it can be severe and even life threatening, which is why we do not perform random pancreatic biopsies and only biopsy a lesion/area we feel is concerning enough to warrant the risk.  She will undergo continued colonoscopies every 5 years and she will be due in 2027 for her follow-up colonoscopy.  She will remain on her PPI therapy.   PLAN  Continue PPI Proceed with scheduling upper endoscopy with EUS - Dilation and disruption to be performed - Biopsy without EMR of duodenal nodule after EUS Colonoscopy 2027   Orders Placed This Encounter  Procedures   Procedural/ Surgical Case Request: UPPER ESOPHAGEAL ENDOSCOPIC ULTRASOUND (EUS)   Ambulatory referral to Gastroenterology    New Prescriptions   No medications on file   Modified Medications   No medications on file    Planned Follow Up No follow-ups on file.   Total Time in Face-to-Face and in Coordination of Care for patient including independent/personal interpretation/review of prior testing, medical history, examination,  medication adjustment, communicating results with the patient directly, and documentation within the EHR is 45 minutes.   Mouhamadou Gittleman Mansouraty, MD [facility] Office # [phone]  

## 2023-01-23 NOTE — Progress Notes (Signed)
GASTROENTEROLOGY OUTPATIENT CLINIC VISIT   Primary Care Provider Irena Reichmann, DO [address] [place] [address] [phone]  Referring Provider Irena Reichmann, DO [address] [place],  [place] [address] [phone]  Patient Profile: Brooke Gomez is a 52 y.o. female with a pmh significant for prior Hodgkin's lymphoma, prior breast cancer, hypertension, hypothyroidism, hyperlipidemia, Schatzki ring, duodenal SEL, family history colon cancer.  The patient presents to the [facility] for an evaluation and management of problem(s) noted below:  Problem List 1. Esophageal dysphagia   2. Schatzki's ring   3. Gastroesophageal reflux disease without esophagitis   4. Hiatal hernia   5. Duodenal nodule   6. Family history of colon cancer     History of Present Illness This is the patient's first visit to the outpatient [facility].  She previously is followed with Dr. Kinnie Scales years but he has since retired.  She has a history of recurrent esophageal dysphagia symptoms.  She has had a Schatzki ring that has been dilated on multiple occasions.  She is also had a known duodenal subepithelial lesion that has not undergone EUS discussed in the past.  Personal history of Hodgkin's lymphoma breast cancer, and family history of colon cancer, she has been undergoing colonoscopies every 5 years.  She is starting to have symptoms of solid food dysphagia and thinks it is time for repeat dilation.  She has not had any other GI symptoms occur.    GI Review of Systems Positive as above Negative for odynophagia, nausea, vomiting, melena, medic easier, change in bowel habits  Review of Systems General: Denies fevers/chills/weight loss unintentionally Cardiovascular: Denies chest pain Pulmonary: Denies shortness of breath Gastroenterological: See HPI Genitourinary: Denies darkened urine Hematological: Denies easy  bruising/bleeding Dermatological: Denies jaundice Psychological: Mood is stable   Medications Current Outpatient Medications  Medication Sig Dispense Refill   amLODipine (NORVASC) 5 MG tablet Take 1 tablet (5 mg total) by mouth daily. 90 tablet 3   aspirin 81 MG tablet Take 81 mg by mouth daily.      benazepril (LOTENSIN) 20 MG tablet Take 1 tablet (20 mg total) by mouth daily. 90 tablet 3   Calcium Carbonate-Vitamin D (CALTRATE 600+D PO) Take 1 tablet by mouth 2 (two) times daily.     levothyroxine (SYNTHROID) 125 MCG tablet Take 1 tablet (125 mcg total) by mouth in the morning on an empty stomach 90 tablet 3   Multiple Vitamins-Minerals (MULTIVITAMIN PO) Take 1 tablet by mouth daily.     NON FORMULARY Pt taking Black cohosh     Omega-3 Fatty Acids (FISH OIL) 1200 MG CAPS Take 1 capsule by mouth 2 (two) times daily.     omeprazole (PRILOSEC) 10 MG capsule Take 1 capsule (10 mg total) by mouth 2 (two) times daily as needed 180 capsule 99   rosuvastatin (CRESTOR) 10 MG tablet Take 1 tablet (10 mg total) by mouth daily. 90 tablet 3   valACYclovir (VALTREX) 1000 MG tablet Take 2 tablets (2,000 mg total) by mouth every 12 (twelve) hours for 1 day as needed 90 tablet 0   zolpidem (AMBIEN) 5 MG tablet Take 1 tablet (5 mg total) by mouth at bedtime as needed. 30 tablet 3   No current facility-administered medications for this visit.    Allergies Allergies  Allergen Reactions   Penicillins Anaphylaxis   Cephalosporins     Rash   Chlorhexidine Gluconate     Rash  Histories Past Medical History:  Diagnosis Date   Adenocarcinoma of breast    bilateral, hx   Hodgkin's disease    personal history   Hyperlipemia    Melanoma    Schatzki's ring    Unspecified essential hypertension    Unspecified hypothyroidism    Past Surgical History:  Procedure Laterality Date   APPENDECTOMY     ESOPHAGEAL DILATION     Multiple times for Schatzki's Ring   MASTECTOMY     bilateral    NOVASURE ABLATION     several lymph node biopies     SPLENECTOMY     THORACOTOMY     THYROIDECTOMY     TONSILLECTOMY     TUBAL LIGATION     Social History   Socioeconomic History   Marital status: Married    Spouse name: Not on file   Number of children: 0   Years of education: Not on file   Highest education level: Not on file  Occupational History   Occupation: PA    Comment: Cardiothoracic Surgery at [employer]   Occupation: PP-C  Tobacco Use   Smoking status: Never   Smokeless tobacco: Never  Vaping Use   Vaping Use: Not on file  Substance and Sexual Activity   Alcohol use: Yes    Alcohol/week: 7.0 - 10.0 standard drinks of alcohol    Types: 7 - 10 Glasses of wine per week    Comment: a glass of wine daily   Drug use: No   Sexual activity: Yes    Birth control/protection: Surgical  Other Topics Concern   Not on file  Social History Narrative   Not on file   Social Determinants of Health   Financial Resource Strain: Not on file  Food Insecurity: Not on file  Transportation Needs: Not on file  Physical Activity: Not on file  Stress: Not on file  Social Connections: Not on file  Intimate Partner Violence: Not on file   Family History  Problem Relation Age of Onset   Hypertension Mother    Cancer Mother 51       Breast cancer   Hyperlipidemia Father    Scleroderma Sister 30       Esophageal   Breast cancer Paternal Aunt        2 paternal aunts had breast cancer in their 49s   Colon cancer Paternal Grandfather    Stomach cancer Neg Hx    Esophageal cancer Neg Hx    Inflammatory bowel disease Neg Hx    Liver disease Neg Hx    Rectal cancer Neg Hx    Pancreatic cancer Neg Hx    I have reviewed her medical, social, and family history in detail and updated the electronic medical record as necessary.    PHYSICAL EXAMINATION  BP 122/80   Pulse (!) 106   Ht 5' 2.5" (1.588 m)   Wt 139 lb (63 kg)   BMI 25.02 kg/m  Wt Readings from Last 3 Encounters:   01/23/23 139 lb (63 kg)  09/04/22 131 lb 9.6 oz (59.7 kg)  09/07/21 129 lb 3.2 oz (58.6 kg)  GEN: NAD, appears stated age, doesn't appear chronically ill PSYCH: Cooperative, without pressured speech EYE: Conjunctivae pink, sclerae anicteric ENT: MMM CV: Nontachycardic RESP: No audible wheezing GI: NABS, soft, NT/ND, without rebound or guarding MSK/EXT: No significant lower extremity edema SKIN: No jaundice NEURO:  Alert & Oriented x 3, no focal deficits   REVIEW OF DATA  I reviewed  the following data at the time of this encounter:  GI Procedures and Studies  June 2022 colonoscopy Patient shows me pictures (uploaded to media tab) and says everything was normal and she was told to follow-up in 5 years due to her personal history of cancer and family history as well  March 2022 EGD Patient shows me pictures (uploaded to media tab) I see the duodenal subepithelial lesion/nodule I see a Schatzki ring and hiatal hernia with what appears to be an irregular Z-line at least as well.  She says she was dilated  January 2021 EGD Distal esophageal stricture-benign, noninflamed Savory esophageal dilation 15/16/17 Hiatal hernia-small, noninflamed Gastric fundic gland polyps-it does not resected Duodenal postbulbar submucosal mass benign in appearance and noted to be unchanged from prior exam Pathology Fundic gland polyps 1 with at least low-grade dysplasia cannot rule out high-grade dysplasia   January 2020 EGD Gastric polyps approximately 36 polyps size 1 to 2 cm appearance fundic gland polyp Postbulbar submucosal lesion, probable lipoma approximately 2 cm Referral for possible EUS Pathology Fundic gland polyps negative for dysplasia   December 2019 EGD Gastric polyps Stomach fundus/stomach body approximately 100 1 to 2 cm each Fundic gland polyps Hiatal hernia approximately 37 to 39 cm containing possible polyp versus fold at EG junction   2019 pathology Fundic gland polyps  with focal low-grade dysplasia   August 2019 EGD Hiatal hernia, 3 cm from 38 to 41 cm, noninflamed Schatzki's ring thin, benign, noninflamed, benign, narrowed Gastric polyps fundus 1 cm frond-like biopsied Fundic gland polyps large numerous Duodenal bulb submucosal mass approximate 1 cm biopsy Savory dilation Consider possible EUS   Pathology Duodenal bulb biopsy is polypoid fragment of mature adipose Brunner's gland otherwise no diagnostic abnormality and no malignancy identified Gastric fundus polypectomy is a fundic gland polyp with hemorrhage   August 2019 colonoscopy No neoplasia Internal hemorrhoids small Normal ileoscopy examination Repeat colonoscopy in 5 years recommended due to family history of colon cancer   Laboratory Studies  Reviewed those in epic  Imaging Studies  No relevant studies to review   ASSESSMENT  Ms. Felicia is a 52 y.o. female with a pmh significant for prior Hodgkin's lymphoma, prior breast cancer, hypertension, hypothyroidism, hyperlipidemia, Schatzki ring, duodenal SEL, family history colon cancer.  The patient is seen today for evaluation and management of:  1. Esophageal dysphagia   2. Schatzki's ring   3. Gastroesophageal reflux disease without esophagitis   4. Hiatal hernia   5. Duodenal nodule   6. Family history of colon cancer    The patient is hemodynamically stable.  Clinically she is having recurring dysphagia symptoms but has had repeat dilations over the years which have been somewhat effective.  Is not clear to me that she has ever had Schatzki ring disruption biopsies performed but we would consider doing that at follow-up.  As well the patient has a duodenal subepithelial lesion that looks like it could be a carcinoid.  She has never undergone EUS.  Makes sense for [place] to go ahead and try to do that at the same time.  She is concerned about the risk of EMR so only wants to have this evaluated via EUS and not removed unless we find that  it turns out to be something on biopsies.  I told her that sometimes biopsies can still miss a deeper lesion but for now she wants to at least have the EUS.  The risks and benefits of endoscopic evaluation were discussed with the patient;  these include but are not limited to the risk of perforation, infection, bleeding, missed lesions, lack of diagnosis, severe illness requiring hospitalization, as well as anesthesia and sedation related illnesses.  The patient and/or family is agreeable to proceed.  The risks of an EUS including intestinal perforation, bleeding, infection, aspiration, and medication effects were discussed as was the possibility it may not give a definitive diagnosis if a biopsy is performed.  When a biopsy of the pancreas is done as part of the EUS, there is an additional risk of pancreatitis at the rate of about 1-2%.  It was explained that procedure related pancreatitis is typically mild, although it can be severe and even life threatening, which is why we do not perform random pancreatic biopsies and only biopsy a lesion/area we feel is concerning enough to warrant the risk.  She will undergo continued colonoscopies every 5 years and she will be due in 2027 for her follow-up colonoscopy.  She will remain on her PPI therapy.   PLAN  Continue PPI Proceed with scheduling upper endoscopy with EUS - Dilation and disruption to be performed - Biopsy without EMR of duodenal nodule after EUS Colonoscopy 2027   Orders Placed This Encounter  Procedures   Procedural/ Surgical Case Request: UPPER ESOPHAGEAL ENDOSCOPIC ULTRASOUND (EUS)   Ambulatory referral to Gastroenterology    New Prescriptions   No medications on file   Modified Medications   No medications on file    Planned Follow Up No follow-ups on file.   Total Time in Face-to-Face and in Coordination of Care for patient including independent/personal interpretation/review of prior testing, medical history, examination,  medication adjustment, communicating results with the patient directly, and documentation within the EHR is 45 minutes.   Corliss Parish, MD [facility] Advanced Endoscopy Office # [phone]

## 2023-01-23 NOTE — Patient Instructions (Signed)
You have been scheduled for an endoscopy. Please follow written instructions given to you at your visit today. If you use inhalers (even only as needed), please bring them with you on the day of your procedure.  _______________________________________________________  If your blood pressure at your visit was 140/90 or greater, please contact your primary care physician to follow up on this.  _______________________________________________________  If you are age 54 or older, your body mass index should be between 23-30. Your Body mass index is 25.02 kg/m. If this is out of the aforementioned range listed, please consider follow up with your Primary Care Provider.  If you are age 15 or younger, your body mass index should be between 19-25. Your Body mass index is 25.02 kg/m. If this is out of the aformentioned range listed, please consider follow up with your Primary Care Provider.   ________________________________________________________  The [facility] providers would like to encourage you to use [facility] to communicate with providers for non-urgent requests or questions.  Due to long hold times on the telephone, sending your provider a message by [facility] may be a faster and more efficient way to get a response.  Please allow 48 business hours for a response.  Please remember that this is for non-urgent requests.  _______________________________________________________  Thank you for choosing me and [facility].  Dr. Rush Landmark

## 2023-01-25 ENCOUNTER — Encounter: Payer: Self-pay | Admitting: Gastroenterology

## 2023-01-25 DIAGNOSIS — K3189 Other diseases of stomach and duodenum: Secondary | ICD-10-CM | POA: Insufficient documentation

## 2023-01-25 DIAGNOSIS — K219 Gastro-esophageal reflux disease without esophagitis: Secondary | ICD-10-CM | POA: Insufficient documentation

## 2023-01-25 DIAGNOSIS — R1310 Dysphagia, unspecified: Secondary | ICD-10-CM | POA: Insufficient documentation

## 2023-01-25 DIAGNOSIS — Z800 Family history of malignant neoplasm of digestive organs: Secondary | ICD-10-CM | POA: Insufficient documentation

## 2023-01-28 ENCOUNTER — Encounter: Payer: Self-pay | Admitting: Gastroenterology

## 2023-01-28 DIAGNOSIS — K449 Diaphragmatic hernia without obstruction or gangrene: Secondary | ICD-10-CM | POA: Insufficient documentation

## 2023-02-12 ENCOUNTER — Encounter (HOSPITAL_COMMUNITY): Payer: Self-pay | Admitting: Gastroenterology

## 2023-02-19 ENCOUNTER — Other Ambulatory Visit: Payer: Self-pay

## 2023-02-19 ENCOUNTER — Ambulatory Visit (HOSPITAL_COMMUNITY)
Admission: RE | Admit: 2023-02-19 | Discharge: 2023-02-19 | Disposition: A | Discharge status: Home / Self Care | Payer: Commercial Managed Care - PPO | Attending: Gastroenterology | Admitting: Gastroenterology

## 2023-02-19 ENCOUNTER — Ambulatory Visit (HOSPITAL_COMMUNITY): Payer: Commercial Managed Care - PPO | Admitting: Certified Registered Nurse Anesthetist

## 2023-02-19 ENCOUNTER — Other Ambulatory Visit (HOSPITAL_COMMUNITY): Payer: Self-pay

## 2023-02-19 ENCOUNTER — Ambulatory Visit (HOSPITAL_BASED_OUTPATIENT_CLINIC_OR_DEPARTMENT_OTHER): Payer: Commercial Managed Care - PPO | Admitting: Certified Registered Nurse Anesthetist

## 2023-02-19 ENCOUNTER — Encounter (HOSPITAL_COMMUNITY)
Admission: RE | Disposition: A | Discharge status: Home / Self Care | Payer: Self-pay | Source: Home / Self Care | Attending: Gastroenterology

## 2023-02-19 ENCOUNTER — Encounter (HOSPITAL_COMMUNITY): Payer: Self-pay | Admitting: Gastroenterology

## 2023-02-19 DIAGNOSIS — K317 Polyp of stomach and duodenum: Secondary | ICD-10-CM

## 2023-02-19 DIAGNOSIS — K2980 Duodenitis without bleeding: Secondary | ICD-10-CM | POA: Diagnosis not present

## 2023-02-19 DIAGNOSIS — K219 Gastro-esophageal reflux disease without esophagitis: Secondary | ICD-10-CM | POA: Diagnosis not present

## 2023-02-19 DIAGNOSIS — K629 Disease of anus and rectum, unspecified: Secondary | ICD-10-CM | POA: Diagnosis not present

## 2023-02-19 DIAGNOSIS — I10 Essential (primary) hypertension: Secondary | ICD-10-CM

## 2023-02-19 DIAGNOSIS — K2210 Ulcer of esophagus without bleeding: Secondary | ICD-10-CM | POA: Insufficient documentation

## 2023-02-19 DIAGNOSIS — Z8571 Personal history of Hodgkin lymphoma: Secondary | ICD-10-CM | POA: Insufficient documentation

## 2023-02-19 DIAGNOSIS — K208 Other esophagitis without bleeding: Secondary | ICD-10-CM

## 2023-02-19 DIAGNOSIS — K222 Esophageal obstruction: Secondary | ICD-10-CM | POA: Diagnosis not present

## 2023-02-19 DIAGNOSIS — K449 Diaphragmatic hernia without obstruction or gangrene: Secondary | ICD-10-CM | POA: Diagnosis not present

## 2023-02-19 DIAGNOSIS — Z853 Personal history of malignant neoplasm of breast: Secondary | ICD-10-CM | POA: Diagnosis not present

## 2023-02-19 DIAGNOSIS — R1310 Dysphagia, unspecified: Secondary | ICD-10-CM | POA: Diagnosis present

## 2023-02-19 DIAGNOSIS — R1314 Dysphagia, pharyngoesophageal phase: Secondary | ICD-10-CM | POA: Diagnosis not present

## 2023-02-19 DIAGNOSIS — Z79899 Other long term (current) drug therapy: Secondary | ICD-10-CM | POA: Insufficient documentation

## 2023-02-19 DIAGNOSIS — K2100 Gastro-esophageal reflux disease with esophagitis, without bleeding: Secondary | ICD-10-CM | POA: Diagnosis not present

## 2023-02-19 DIAGNOSIS — E785 Hyperlipidemia, unspecified: Secondary | ICD-10-CM | POA: Insufficient documentation

## 2023-02-19 DIAGNOSIS — Z08 Encounter for follow-up examination after completed treatment for malignant neoplasm: Secondary | ICD-10-CM | POA: Diagnosis not present

## 2023-02-19 DIAGNOSIS — K929 Disease of digestive system, unspecified: Secondary | ICD-10-CM | POA: Diagnosis not present

## 2023-02-19 DIAGNOSIS — I899 Noninfective disorder of lymphatic vessels and lymph nodes, unspecified: Secondary | ICD-10-CM | POA: Diagnosis not present

## 2023-02-19 DIAGNOSIS — R12 Heartburn: Secondary | ICD-10-CM | POA: Diagnosis present

## 2023-02-19 DIAGNOSIS — E039 Hypothyroidism, unspecified: Secondary | ICD-10-CM | POA: Diagnosis not present

## 2023-02-19 DIAGNOSIS — K829 Disease of gallbladder, unspecified: Secondary | ICD-10-CM | POA: Diagnosis not present

## 2023-02-19 DIAGNOSIS — K3189 Other diseases of stomach and duodenum: Secondary | ICD-10-CM

## 2023-02-19 DIAGNOSIS — Z800 Family history of malignant neoplasm of digestive organs: Secondary | ICD-10-CM | POA: Diagnosis not present

## 2023-02-19 HISTORY — PX: ESOPHAGOGASTRODUODENOSCOPY (EGD) WITH PROPOFOL: SHX5813

## 2023-02-19 HISTORY — PX: SAVORY DILATION: SHX5439

## 2023-02-19 HISTORY — PX: EUS: SHX5427

## 2023-02-19 HISTORY — PX: BIOPSY: SHX5522

## 2023-02-19 SURGERY — ESOPHAGOGASTRODUODENOSCOPY (EGD) WITH PROPOFOL
Anesthesia: Monitor Anesthesia Care

## 2023-02-19 MED ORDER — AMISULPRIDE (ANTIEMETIC) 5 MG/2ML IV SOLN: 10.0000 mg | Freq: Once | INTRAVENOUS | Status: DC | PRN

## 2023-02-19 MED ORDER — OMEPRAZOLE 40 MG PO CPDR
40.0000 mg | DELAYED_RELEASE_CAPSULE | Freq: Two times a day (BID) | ORAL | 6 refills | Status: AC
  Filled 2023-02-19: qty 60, 30d supply, fill #0
  Filled 2023-03-23: qty 60, 30d supply, fill #1
  Filled 2023-04-16: qty 60, 30d supply, fill #2
  Filled 2023-07-16: qty 60, 30d supply, fill #3
  Filled 2023-08-21: qty 60, 30d supply, fill #4
  Filled 2023-10-02: qty 60, 30d supply, fill #5
  Filled 2023-11-15: qty 60, 30d supply, fill #6

## 2023-02-19 MED ORDER — PROPOFOL 500 MG/50ML IV EMUL
INTRAVENOUS | Status: AC
  Filled 2023-02-19: qty 50

## 2023-02-19 MED ORDER — ONDANSETRON HCL 4 MG/2ML IJ SOLN: 4.0000 mg | Freq: Once | INTRAMUSCULAR | Status: DC | PRN

## 2023-02-19 MED ORDER — PROPOFOL 10 MG/ML IV BOLUS
INTRAVENOUS | Status: DC | PRN
  Administered 2023-02-19 (×4): 40 mg via INTRAVENOUS

## 2023-02-19 MED ORDER — LACTATED RINGERS IV SOLN
INTRAVENOUS | Status: AC | PRN
  Administered 2023-02-19: 1000 mL via INTRAVENOUS

## 2023-02-19 MED ORDER — SODIUM CHLORIDE 0.9 % IV SOLN: INTRAVENOUS | Status: DC

## 2023-02-19 MED ORDER — PROPOFOL 500 MG/50ML IV EMUL
INTRAVENOUS | Status: AC
  Filled 2023-02-19: qty 100

## 2023-02-19 MED ORDER — SUCRALFATE 1 GM/10ML PO SUSP
1.0000 g | Freq: Two times a day (BID) | ORAL | 0 refills | Status: DC
  Filled 2023-02-19: qty 420, 21d supply, fill #0

## 2023-02-19 MED ORDER — ONDANSETRON HCL 4 MG/2ML IJ SOLN
INTRAMUSCULAR | Status: DC | PRN
  Administered 2023-02-19: 4 mg via INTRAVENOUS

## 2023-02-19 MED ORDER — LIDOCAINE 2% (20 MG/ML) 5 ML SYRINGE
INTRAMUSCULAR | Status: DC | PRN
  Administered 2023-02-19: 40 mg via INTRAVENOUS

## 2023-02-19 MED ORDER — PROPOFOL 500 MG/50ML IV EMUL
INTRAVENOUS | Status: DC | PRN
  Administered 2023-02-19: 125 ug/kg/min via INTRAVENOUS

## 2023-02-19 MED ORDER — SUCRALFATE 1 G PO TABS
1.0000 g | ORAL_TABLET | Freq: Two times a day (BID) | ORAL | 0 refills | Status: AC
  Filled 2023-02-19: qty 28, 14d supply, fill #0

## 2023-02-19 NOTE — Anesthesia Postprocedure Evaluation (Signed)
Anesthesia Post Note  Patient: Arelyn Gauer Martire  Procedure(s) Performed: FULL UPPER ENDOSCOPIC ULTRASOUND (EUS) RADIAL ESOPHAGOGASTRODUODENOSCOPY (EGD) WITH PROPOFOL SAVORY DILATION BIOPSY     Patient location during evaluation: PACU Anesthesia Type: MAC Level of consciousness: awake and alert Pain management: pain level controlled Vital Signs Assessment: post-procedure vital signs reviewed and stable Respiratory status: spontaneous breathing, nonlabored ventilation, respiratory function stable and patient connected to nasal cannula oxygen Cardiovascular status: stable and blood pressure returned to baseline Postop Assessment: no apparent nausea or vomiting Anesthetic complications: no  No notable events documented.  Last Vitals:  Vitals:   02/19/23 1315 02/19/23 1320  BP: 108/71 108/71  Pulse: (!) 109 93  Resp: 18 16  Temp:    SpO2: 97% 97%    Last Pain:  Vitals:   02/19/23 1315  TempSrc:   PainSc: 0-No pain                 Shelton Silvas

## 2023-02-19 NOTE — Transfer of Care (Signed)
Immediate Anesthesia Transfer of Care Note  Patient: Brooke Gomez  Procedure(s) Performed: FULL UPPER ENDOSCOPIC ULTRASOUND (EUS) RADIAL ESOPHAGOGASTRODUODENOSCOPY (EGD) WITH PROPOFOL SAVORY DILATION BIOPSY  Patient Location: Endoscopy Unit  Anesthesia Type:MAC  Level of Consciousness: awake and pateint uncooperative  Airway & Oxygen Therapy: Patient Spontanous Breathing and Patient connected to face mask  Post-op Assessment: Report given to RN and Post -op Vital signs reviewed and stable  Post vital signs: Reviewed and stable  Last Vitals:  Vitals Value Taken Time  BP 107/62 02/19/23 1308  Temp    Pulse 106 02/19/23 1309  Resp 19 02/19/23 1309  SpO2 99 % 02/19/23 1309  Vitals shown include unvalidated device data.  Last Pain:  Vitals:   02/19/23 1137  TempSrc: Tympanic  PainSc: 0-No pain         Complications: No notable events documented.

## 2023-02-19 NOTE — Discharge Instructions (Signed)
YOU HAD AN ENDOSCOPIC PROCEDURE TODAY: Refer to the procedure report and other information in the discharge instructions given to you for any specific questions about what was found during the examination. If this information does not answer your questions, please call [place] office at [phone] to clarify.  ° °YOU SHOULD EXPECT: Some feelings of bloating in the abdomen. Passage of more gas than usual. Walking can help get rid of the air that was put into your GI tract during the procedure and reduce the bloating. If you had a lower endoscopy (such as a colonoscopy or flexible sigmoidoscopy) you may notice spotting of blood in your stool or on the toilet paper. Some abdominal soreness may be present for a day or two, also. ° °DIET: Your first meal following the procedure should be a light meal and then it is ok to progress to your normal diet. A half-sandwich or bowl of soup is an example of a good first meal. Heavy or fried foods are harder to digest and may make you feel nauseous or bloated. Drink plenty of fluids but you should avoid alcoholic beverages for 24 hours. If you had a esophageal dilation, please see attached instructions for diet.   ° °ACTIVITY: Your care partner should take you home directly after the procedure. You should plan to take it easy, moving slowly for the rest of the day. You can resume normal activity the day after the procedure however YOU SHOULD NOT DRIVE, use power tools, machinery or perform tasks that involve climbing or major physical exertion for 24 hours (because of the sedation medicines used during the test).  ° °SYMPTOMS TO REPORT IMMEDIATELY: °A gastroenterologist can be reached at any hour. Please call [phone]  for any of the following symptoms:  °Following lower endoscopy (colonoscopy, flexible sigmoidoscopy) °Excessive amounts of blood in the stool  °Significant tenderness, worsening of abdominal pains  °Swelling of the abdomen that is new, acute  °Fever of 100° or  higher  °Following upper endoscopy (EGD, EUS, ERCP, esophageal dilation) °Vomiting of blood or coffee ground material  °New, significant abdominal pain  °New, significant chest pain or pain under the shoulder blades  °Painful or persistently difficult swallowing  °New shortness of breath  °Black, tarry-looking or red, bloody stools ° °FOLLOW UP:  °If any biopsies were taken you will be contacted by phone or by letter within the next 1-3 weeks. Call [phone]  if you have not heard about the biopsies in 3 weeks.  °Please also call with any specific questions about appointments or follow up tests. ° °

## 2023-02-19 NOTE — Interval H&P Note (Signed)
History and Physical Interval Note:  02/19/2023 12:02 PM  Brooke Gomez  has presented today for surgery, with the diagnosis of Duodenal Nodule, Fam hx of Colon Cancer.  The various methods of treatment have been discussed with the patient and family. After consideration of risks, benefits and other options for treatment, the patient has consented to  Procedure(s): UPPER ESOPHAGEAL ENDOSCOPIC ULTRASOUND (EUS) (N/A) as a surgical intervention.  The patient's history has been reviewed, patient examined, no change in status, stable for surgery.  I have reviewed the patient's chart and labs.  Questions were answered to the patient's satisfaction.    The risks of an EUS including intestinal perforation, bleeding, infection, aspiration, and medication effects were discussed as was the possibility it may not give a definitive diagnosis if a biopsy is performed.  When a biopsy of the pancreas is done as part of the EUS, there is an additional risk of pancreatitis at the rate of about 1-2%.  It was explained that procedure related pancreatitis is typically mild, although it can be severe and even life threatening, which is why we do not perform random pancreatic biopsies and only biopsy a lesion/area we feel is concerning enough to warrant the risk.    [employer]

## 2023-02-19 NOTE — Anesthesia Preprocedure Evaluation (Addendum)
Anesthesia Evaluation  Patient identified by MRN, date of birth, ID band Patient awake    Reviewed: Allergy & Precautions, NPO status , Patient's Chart, lab work & pertinent test results  Airway Mallampati: I  TM Distance: >3 FB Neck ROM: Full    Dental  (+) Teeth Intact, Dental Advisory Given   Pulmonary neg pulmonary ROS   breath sounds clear to auscultation       Cardiovascular hypertension, Pt. on medications + Valvular Problems/Murmurs  Rhythm:Regular Rate:Normal     Neuro/Psych negative neurological ROS  negative psych ROS   GI/Hepatic Neg liver ROS, hiatal hernia,GERD  ,,  Endo/Other  Hypothyroidism    Renal/GU negative Renal ROS     Musculoskeletal negative musculoskeletal ROS (+)    Abdominal   Peds  Hematology negative hematology ROS (+)   Anesthesia Other Findings   Reproductive/Obstetrics                             Anesthesia Physical Anesthesia Plan  ASA: 2  Anesthesia Plan: MAC   Post-op Pain Management: Minimal or no pain anticipated   Induction: Intravenous  PONV Risk Score and Plan: 0 and Ondansetron and Propofol infusion  Airway Management Planned: Natural Airway  Additional Equipment: None  Intra-op Plan:   Post-operative Plan:   Informed Consent: I have reviewed the patients History and Physical, chart, labs and discussed the procedure including the risks, benefits and alternatives for the proposed anesthesia with the patient or authorized representative who has indicated his/her understanding and acceptance.       Plan Discussed with: CRNA  Anesthesia Plan Comments:        Anesthesia Quick Evaluation

## 2023-02-19 NOTE — Op Note (Signed)
[facility] Patient Name: Brooke Gomez Procedure Date: 02/19/2023 MRN: [id number] Attending MD: Corliss Parish , MD, [id number] Date of Birth: [birth year] CSN: [id number] Age: 52 Admit Type: Outpatient Procedure:                Upper EUS Indications:              Submucosal tumor versus extrinsic mass found on                            endoscopy, Dysphagia, Heartburn Providers:                Corliss Parish, MD, Zoe Lan, RN, Harrington Challenger, Technician, Nadene Rubins Referring MD:             Corliss Parish, MD Medicines:                Monitored Anesthesia Care Complications:            No immediate complications. Estimated Blood Loss:     Estimated blood loss was minimal. Procedure:                Pre-Anesthesia Assessment:                           - Prior to the procedure, a History and Physical                            was performed, and patient medications and                            allergies were reviewed. The patient's tolerance of                            previous anesthesia was also reviewed. The risks                            and benefits of the procedure and the sedation                            options and risks were discussed with the patient.                            All questions were answered, and informed consent                            was obtained. Prior Anticoagulants: The patient has                            taken no anticoagulant or antiplatelet agents. ASA                            Grade Assessment: II - A patient with mild systemic  disease. After reviewing the risks and benefits,                            the patient was deemed in satisfactory condition to                            undergo the procedure.                           After obtaining informed consent, the endoscope was                            passed under direct vision. Throughout the                             procedure, the patient's blood pressure, pulse, and                            oxygen saturations were monitored continuously. The                            [id number]-[id number] ([id number] Olympus therapeutic endoscope                            was introduced through the mouth, and advanced to                            the second part of duodenum. The [id number]-[id number]                            ([id number]) Olympus radial ultrasound scope was                            introduced through the mouth, and advanced to the                            duodenum for ultrasound examination from the                            stomach and duodenum. The upper EUS was                            accomplished without difficulty. The patient                            tolerated the procedure. Scope In: Scope Out: Findings:      ENDOSCOPIC FINDING: :      No gross lesions were noted in the majority of the esophagus. Biopsies       were taken with a cold forceps for histology to rule out EOE/LOE.      LA Grade C (one or more mucosal breaks continuous between tops of 2 or       more mucosal folds, less than 75% circumference) esophagitis with no       bleeding was found in the distal esophagus.  A low-grade of narrowing Schatzki ring was found at the gastroesophageal       junction. Biopsies were taken with a cold forceps for disruption       purposes.      The Z-line was regular and was found 35 cm from the incisors.      A 2 cm hiatal hernia was present.      Multiple small sessile polyps with no bleeding and no stigmata of recent       bleeding were found in the cardia, in the gastric fundus and in the       gastric body.      No gross lesions were noted in the entire examined stomach.      A single 10 mm subepithelial nodule was found in the duodenal bulb.       After the EUS was completed, tunneled biopsies were taken with a cold       forceps for histology.      No other gross lesions  were noted in the duodenal bulb, in the first       portion of the duodenum and in the second portion of the duodenum.      After the rest of the EGD/EUS was completed, a guidewire was placed and       the scope was withdrawn. Dilation was performed in the esophagus with a       Savary dilator with no resistance at 15 mm, mild resistance at 16 mm and       moderate resistance at 17 mm. The dilation site was examined following       endoscope reinsertion after each dilation, and showed moderate mucosal       disruption, moderate improvement in luminal narrowing and no perforation.      ENDOSONOGRAPHIC FINDING: :      A round intramural (subepithelial) lesion was found in the apex of the       duodenal bulb. The lesion was hypoechoic. Endosonographically, the       lesion appeared to originate from within the deep mucosa (Layer 2). The       lesion measured 9 mm (in maximum thickness). The lesion also measured 6       mm in diameter. The outer margins were well defined.      Endosonographic imaging in the visualized portion of the liver showed no       mass.      No malignant-appearing lymph nodes were visualized in the perigastric       region and porta hepatis region.      The celiac region was visualized. Impression:               EGD impression:                           - No gross lesions in the esophagus. Biopsied.                           - LA Grade C erosive esophagitis with no bleeding                            found distally.                           - Low-grade of narrowing Schatzki  ring. Disrupted.                           - Z-line regular, 35 cm from the incisors.                           - 2 cm hiatal hernia.                           - Multiple gastric polyps.                           - Subepithelial nodule found in the duodenum.                            Tunnel biopsied.                           - No other gross lesions in the duodenal bulb, in                             the first portion of the duodenum and in the second                            portion of the duodenum.                           - Dilation performed in the esophagus with                            appropriate mucosal wrent noted.                           EUS impression:                           - An intramural (subepithelial) lesion was found in                            the apex of the duodenal bulb. The lesion appeared                            to originate from within the deep mucosa (Layer 2).                            Tissue has not been obtained. However, the                            endosonographic appearance is suggestive of a                            potential leiomyoma, MM GIST, carcinoid, benign                            lesion.                           -  No malignant-appearing lymph nodes were                            visualized in the perigastric region and porta                            hepatis region. Moderate Sedation:      Not Applicable - Patient had care per Anesthesia. Recommendation:           - The patient will be observed post-procedure,                            until all discharge criteria are met.                           - Discharge patient to home.                           - Please use Cepacol or Halls Lozenges +/-                            Chloraseptic spray for next 72-96 hours to aid in                            sore thoat should you experience this.                           - Dilation diet as per protocol.                           - Monitor for signs/symptoms of bleeding,                            perforation, and infection. If issues please call                            our number to get further assistance as needed.                           - Observe patient's clinical course.                           - Await path results.                           - Recommend increasing PPI significantly to help                             with healing of esophagitis.                           - Consider Carafate therapy for period of time to                            help with healing.                           -  Within the next 2 to 3 months would recommend                            repeat EGD with further dilation.                           - Pending pathology of the duodenal nodule, if this                            is a carcinoid/NET we will need to consider                            potential role of resection. If pathology is                            nondiagnostic, consider EMR versus close monitoring                            with EUS every 1 to 2 years.                           - The findings and recommendations were discussed                            with the patient.                           - The findings and recommendations were discussed                            with the patient's family. Procedure Code(s):        --- Professional ---                           [id number], Esophagogastroduodenoscopy, flexible,                            transoral; with endoscopic ultrasound examination                            limited to the esophagus, stomach or duodenum, and                            adjacent structures                           43248, Esophagogastroduodenoscopy, flexible,                            transoral; with insertion of guide wire followed by                            passage of dilator(s) through esophagus over guide  wire                           43239, 59, Esophagogastroduodenoscopy, flexible,                            transoral; with biopsy, single or multiple Diagnosis Code(s):        --- Professional ---                           K20.80, Other esophagitis without bleeding                           K22.2, Esophageal obstruction                           K44.9, Diaphragmatic hernia without obstruction or                            gangrene                            K31.7, Polyp of stomach and duodenum                           K31.89, Other diseases of stomach and duodenum                           I89.9, Noninfective disorder of lymphatic vessels                            and lymph nodes, unspecified                           K92.9, Disease of digestive system, unspecified                           R13.10, Dysphagia, unspecified                           R12, Heartburn CPT copyright 2022 [employer]. All rights reserved. The codes documented in this report are preliminary and upon coder review may  be revised to meet current compliance requirements. Corliss Parish, MD 02/19/2023 1:24:08 PM Number of Addenda: 0

## 2023-02-19 NOTE — Anesthesia Procedure Notes (Signed)
Procedure Name: MAC Date/Time: 02/19/2023 12:26 PM  Performed by: Vanessa , CRNAPre-anesthesia Checklist: Patient identified, Emergency Drugs available, Suction available and Patient being monitored Patient Re-evaluated:Patient Re-evaluated prior to induction Oxygen Delivery Method: Simple face mask

## 2023-02-20 ENCOUNTER — Encounter: Payer: Self-pay | Admitting: Gastroenterology

## 2023-02-20 LAB — SURGICAL PATHOLOGY

## 2023-02-21 ENCOUNTER — Encounter (HOSPITAL_COMMUNITY): Payer: Self-pay | Admitting: Gastroenterology

## 2023-03-16 DIAGNOSIS — D225 Melanocytic nevi of trunk: Secondary | ICD-10-CM | POA: Diagnosis not present

## 2023-03-16 DIAGNOSIS — Z85820 Personal history of malignant melanoma of skin: Secondary | ICD-10-CM | POA: Diagnosis not present

## 2023-03-16 DIAGNOSIS — Z08 Encounter for follow-up examination after completed treatment for malignant neoplasm: Secondary | ICD-10-CM | POA: Diagnosis not present

## 2023-03-16 DIAGNOSIS — Z1283 Encounter for screening for malignant neoplasm of skin: Secondary | ICD-10-CM | POA: Diagnosis not present

## 2023-03-16 DIAGNOSIS — D485 Neoplasm of uncertain behavior of skin: Secondary | ICD-10-CM | POA: Diagnosis not present

## 2023-04-06 DIAGNOSIS — Z1283 Encounter for screening for malignant neoplasm of skin: Secondary | ICD-10-CM | POA: Diagnosis not present

## 2023-04-06 DIAGNOSIS — D485 Neoplasm of uncertain behavior of skin: Secondary | ICD-10-CM | POA: Diagnosis not present

## 2023-04-17 ENCOUNTER — Other Ambulatory Visit: Payer: Self-pay

## 2023-06-20 ENCOUNTER — Other Ambulatory Visit (HOSPITAL_COMMUNITY): Payer: Self-pay

## 2023-06-20 MED ORDER — VEOZAH 45 MG PO TABS
45.0000 mg | ORAL_TABLET | Freq: Every day | ORAL | 11 refills | Status: DC
  Filled 2023-06-20: qty 30, 30d supply, fill #0
  Filled 2023-07-16: qty 30, 30d supply, fill #1
  Filled 2023-08-21: qty 30, 30d supply, fill #2
  Filled 2023-09-13: qty 30, 30d supply, fill #3
  Filled 2023-09-14: qty 90, 90d supply, fill #3
  Filled 2023-09-14 (×2): qty 30, 30d supply, fill #3
  Filled 2023-10-18: qty 30, 30d supply, fill #4
  Filled 2023-11-15: qty 30, 30d supply, fill #5
  Filled 2023-12-19: qty 30, 30d supply, fill #6
  Filled 2024-01-14: qty 30, 30d supply, fill #7
  Filled 2024-02-05 – 2024-02-11 (×2): qty 30, 30d supply, fill #8
  Filled 2024-03-13: qty 30, 30d supply, fill #9
  Filled 2024-04-10: qty 30, 30d supply, fill #10
  Filled 2024-05-14: qty 30, 30d supply, fill #11

## 2023-06-21 ENCOUNTER — Other Ambulatory Visit (HOSPITAL_COMMUNITY): Payer: Self-pay

## 2023-06-29 DIAGNOSIS — M542 Cervicalgia: Secondary | ICD-10-CM | POA: Diagnosis not present

## 2023-07-10 DIAGNOSIS — M542 Cervicalgia: Secondary | ICD-10-CM | POA: Diagnosis not present

## 2023-07-13 ENCOUNTER — Other Ambulatory Visit (HOSPITAL_COMMUNITY): Payer: Self-pay

## 2023-07-16 ENCOUNTER — Other Ambulatory Visit: Payer: Self-pay

## 2023-07-17 ENCOUNTER — Other Ambulatory Visit (HOSPITAL_COMMUNITY): Payer: Self-pay

## 2023-08-22 ENCOUNTER — Other Ambulatory Visit (HOSPITAL_COMMUNITY): Payer: Self-pay

## 2023-09-07 ENCOUNTER — Ambulatory Visit (HOSPITAL_COMMUNITY): Payer: Commercial Managed Care - PPO | Attending: Internal Medicine

## 2023-09-07 DIAGNOSIS — I350 Nonrheumatic aortic (valve) stenosis: Secondary | ICD-10-CM | POA: Insufficient documentation

## 2023-09-07 DIAGNOSIS — E782 Mixed hyperlipidemia: Secondary | ICD-10-CM | POA: Insufficient documentation

## 2023-09-07 DIAGNOSIS — E559 Vitamin D deficiency, unspecified: Secondary | ICD-10-CM | POA: Diagnosis not present

## 2023-09-07 DIAGNOSIS — I10 Essential (primary) hypertension: Secondary | ICD-10-CM | POA: Diagnosis not present

## 2023-09-07 DIAGNOSIS — I050 Rheumatic mitral stenosis: Secondary | ICD-10-CM | POA: Insufficient documentation

## 2023-09-07 DIAGNOSIS — E7800 Pure hypercholesterolemia, unspecified: Secondary | ICD-10-CM | POA: Diagnosis not present

## 2023-09-07 DIAGNOSIS — E039 Hypothyroidism, unspecified: Secondary | ICD-10-CM | POA: Diagnosis not present

## 2023-09-07 DIAGNOSIS — Z79899 Other long term (current) drug therapy: Secondary | ICD-10-CM | POA: Diagnosis not present

## 2023-09-07 DIAGNOSIS — R7309 Other abnormal glucose: Secondary | ICD-10-CM | POA: Diagnosis not present

## 2023-09-07 LAB — ECHOCARDIOGRAM COMPLETE
AR max vel: 1.23 cm2
AV Area VTI: 1.34 cm2
AV Area mean vel: 1.22 cm2
AV Mean grad: 10 mm[Hg]
AV Peak grad: 17.9 mm[Hg]
Ao pk vel: 2.11 m/s
Area-P 1/2: 3.91 cm2
MV VTI: 0.97 cm2
P 1/2 time: 101 ms
S' Lateral: 2.4 cm

## 2023-09-10 ENCOUNTER — Encounter: Payer: Self-pay | Admitting: Cardiovascular Disease

## 2023-09-10 ENCOUNTER — Other Ambulatory Visit (HOSPITAL_COMMUNITY): Payer: Self-pay

## 2023-09-10 ENCOUNTER — Ambulatory Visit: Payer: Commercial Managed Care - PPO | Attending: Cardiovascular Disease | Admitting: Cardiovascular Disease

## 2023-09-10 VITALS — BP 128/66 | HR 98 | Ht 62.0 in | Wt 142.0 lb

## 2023-09-10 DIAGNOSIS — I350 Nonrheumatic aortic (valve) stenosis: Secondary | ICD-10-CM

## 2023-09-10 DIAGNOSIS — E7800 Pure hypercholesterolemia, unspecified: Secondary | ICD-10-CM | POA: Diagnosis not present

## 2023-09-10 DIAGNOSIS — I10 Essential (primary) hypertension: Secondary | ICD-10-CM | POA: Diagnosis not present

## 2023-09-10 DIAGNOSIS — Z0000 Encounter for general adult medical examination without abnormal findings: Secondary | ICD-10-CM | POA: Diagnosis not present

## 2023-09-10 DIAGNOSIS — I050 Rheumatic mitral stenosis: Secondary | ICD-10-CM | POA: Diagnosis not present

## 2023-09-10 DIAGNOSIS — E890 Postprocedural hypothyroidism: Secondary | ICD-10-CM | POA: Diagnosis not present

## 2023-09-10 DIAGNOSIS — E782 Mixed hyperlipidemia: Secondary | ICD-10-CM | POA: Diagnosis not present

## 2023-09-10 MED ORDER — VALACYCLOVIR HCL 1 G PO TABS
2000.0000 mg | ORAL_TABLET | Freq: Two times a day (BID) | ORAL | 1 refills | Status: AC
  Filled 2023-09-10: qty 90, 23d supply, fill #0

## 2023-09-10 NOTE — Assessment & Plan Note (Addendum)
Continue statin Rx. Recent labs reviewed and show ALT and AST of 40 and 43, respectively.  Creatinine 0.77.  Total cholesterol is 165, HDL 64, LDL 83, triglycerides 98.

## 2023-09-10 NOTE — Assessment & Plan Note (Signed)
BP well controlled.

## 2023-09-10 NOTE — Patient Instructions (Signed)
Testing/Procedures: ECHO (prior to next year's visit) Your physician has requested that you have an echocardiogram. Echocardiography is a painless test that uses sound waves to create images of your heart. It provides your doctor with information about the size and shape of your heart and how well your heart's chambers and valves are working. This procedure takes approximately one hour. There are no restrictions for this procedure. Please do NOT wear cologne, perfume, aftershave, or lotions (deodorant is allowed). Please arrive 15 minutes prior to your appointment time.  Please note: We ask at that you not bring children with you during ultrasound (echo/ vascular) testing. Due to room size and safety concerns, children are not allowed in the ultrasound rooms during exams. Our front office staff cannot provide observation of children in our lobby area while testing is being conducted. An adult accompanying a patient to their appointment will only be allowed in the ultrasound room at the discretion of the ultrasound technician under special circumstances. We apologize for any inconvenience.  Follow-Up: At [facility], you and your health needs are our priority.  As part of our continuing mission to provide you with exceptional heart care, we have created designated Provider Care Teams.  These Care Teams include your primary Cardiologist (physician) and Advanced Practice Providers (APPs -  Physician Assistants and Nurse Practitioners) who all work together to provide you with the care you need, when you need it.  Your next appointment:   1 year(s)  Provider:   Tonny Bollman, MD

## 2023-09-10 NOTE — Progress Notes (Signed)
Cardiology Office Note:    Date:  09/10/2023   ID:  RYIA CUSIC, DOB [birth year], MRN [id number]  PCP:  Irena Reichmann, DO   [facility] Providers Cardiologist:  Tonny Bollman, MD     Referring MD: Irena Reichmann, DO   Chief Complaint  Patient presents with   Follow-up    History of Present Illness:    Brooke Gomez is a 52 y.o. female presenting for follow-up of valvular heart disease.  The patient has a history of Hodgkin's lymphoma treated with thoracotomy, chemotherapy, and radiation remotely.  She has been followed for mild aortic and mitral stenosis thought to be related to her past history of mediastinal radiation therapy.  Other problems include hypertension and mixed hyperlipidemia.  The patient is here alone today. She is doing well. She just got back from a trip to [facility] where she saw the [employer] at [employer]. She walked one day almost 40,000 steps. She has mild DOE with stairs. No chest pain, chest pressure, edema, lightheadedness, or other symptoms at present.   Current Medications: Current Meds  Medication Sig   amLODipine (NORVASC) 5 MG tablet Take 1 tablet (5 mg total) by mouth daily.   aspirin 81 MG tablet Take 81 mg by mouth every Monday, Wednesday, and Friday.   benazepril (LOTENSIN) 20 MG tablet Take 1 tablet (20 mg total) by mouth daily.   Calcium Carb-Cholecalciferol (CALCIUM 500 + D3) 500-5 MG-MCG TABS 1 mg daily.   Calcium Carbonate-Vitamin D (CALTRATE 600+D PO) Take 1 tablet by mouth 2 (two) times daily.   diphenhydrAMINE (BENADRYL ALLERGY) 25 MG tablet 25 mg at bedtime as needed.   Fezolinetant (VEOZAH) 45 MG TABS Take 1 tablet (45 mg total) by mouth daily.   levothyroxine (SYNTHROID) 125 MCG tablet Take 1 tablet (125 mcg total) by mouth in the morning on an empty stomach   Multiple Vitamins-Minerals (MULTIVITAMIN PO) Take 1 tablet by mouth daily.   NON FORMULARY Pt taking Black cohosh   Omega-3 Fatty Acids (FISH OIL) 1200 MG CAPS  Take 1 capsule by mouth 2 (two) times daily.   omeprazole (PRILOSEC) 40 MG capsule Take 1 capsule (40 mg total) by mouth 2 (two) times daily. Twice daily for 2 months at minimum.   rosuvastatin (CRESTOR) 10 MG tablet Take 1 tablet (10 mg total) by mouth daily.   valACYclovir (VALTREX) 1000 MG tablet Take 2 tablets (2,000 mg total) by mouth every 12 (twelve) hours for 1 day as needed   zolpidem (AMBIEN) 5 MG tablet Take 1 tablet (5 mg total) by mouth at bedtime as needed.     Allergies:   Penicillins, Cephalosporins, and Chlorhexidine gluconate   ROS:   Please see the history of present illness.    All other systems reviewed and are negative.  EKGs/Labs/Other Studies Reviewed:    The following studies were reviewed today: Cardiac Studies & Procedures       ECHOCARDIOGRAM  ECHOCARDIOGRAM COMPLETE 09/07/2023  Narrative ECHOCARDIOGRAM REPORT    Patient Name:   Brooke Gomez [facility] Date of Exam: 09/07/2023 Medical Rec #:  [id number]        Height:       62.0 in Accession #:    [id number]       Weight:       138.9 lb Date of Birth:  [birth year]       BSA:          1.637 m Patient Age:    38 years  BP:           122/80 mmHg Patient Gender: F                HR:           104 bpm. Exam Location:  [address]  Procedure: 2D Echo, Cardiac Doppler and Color Doppler  Indications:    I35.0 Aortic Stenosis  History:        Patient has prior history of Echocardiogram examinations, most recent 09/04/2022. Risk Factors:Dyslipidemia.  Sonographer:    Sedonia Small Rodgers-Jones RDCS Referring Phys: [id number] Krabill  IMPRESSIONS   1. Left ventricular ejection fraction, by estimation, is 70 to 75%. The left ventricle has hyperdynamic function. The left ventricle has no regional wall motion abnormalities. There is mild left ventricular hypertrophy. Left ventricular diastolic parameters are indeterminate. 2. Right ventricular systolic function is normal. The right ventricular size is  normal. 3. Left atrial size was mildly dilated. 4. Right atrial size was mildly dilated. 5. 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 The transmitral gradient is elevated but pressure half-time measurements and calculated area (2.2cm2) are normal suggesting that no significan mitral stenosis. The mitral valve is abnormal. No evidence of mitral valve regurgitation. No evidence of mitral stenosis. The mean mitral valve gradient is 12.0 mmHg. Severe mitral annular calcification. 6. The aortic valve is tricuspid it is moderately calcified. The left and non-coronary cusps are fused. There is mild to moderate AS. The aortic valve is tricuspid. There is moderate calcification of the aortic valve. Aortic valve regurgitation is not visualized. Mild to moderate aortic valve stenosis. Aortic valve area, by VTI measures 1.34 cm. Aortic valve mean gradient measures 10.0 mmHg. Aortic valve Vmax measures 2.11 m/s. 7. There is dilatation of the aortic root. 8. The inferior vena cava is normal in size with greater than 50% respiratory variability, suggesting right atrial pressure of 3 mmHg.  FINDINGS Left Ventricle: Left ventricular ejection fraction, by estimation, is 70 to 75%. The left ventricle has hyperdynamic function. The left ventricle has no regional wall motion abnormalities. The left ventricular internal cavity size was normal in size. There is mild left ventricular hypertrophy. Left ventricular diastolic parameters are indeterminate.  Right Ventricle: The right ventricular size is normal. No increase in right ventricular wall thickness. Right ventricular systolic function is normal.  Left Atrium: Left atrial size was mildly dilated.  Right Atrium: Right atrial size was mildly dilated.  Pericardium: There is no evidence of pericardial effusion.  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 The transmitral  gradient is elevated but pressure half-time measurements and calculated area (2.2cm2) are normal suggesting that no significan mitral stenosis. The mitral valve is abnormal. There is mild calcification of the mitral valve leaflet(s). Severe mitral annular calcification. No evidence of mitral valve regurgitation. No evidence of mitral valve stenosis. Tricuspid Valve: The tricuspid valve is normal in structure. Tricuspid valve regurgitation is not demonstrated. No evidence of tricuspid stenosis.  The aortic valve is tricuspid it is moderately calcified. The left and non-coronary cusps are fused. There is mild to moderate AS. The aortic valve is tricuspid. There is moderate calcification of the aortic valve. Aortic valve regurgitation is not visualized. Mild to moderate aortic stenosis is present. Pulmonic Valve: The pulmonic valve was normal in structure. Pulmonic valve regurgitation is trivial. No evidence of pulmonic stenosis.  Aorta: The aortic root is normal in size and structure. There is dilatation  of the aortic root.  Venous: The inferior vena cava is normal in size with greater than 50% respiratory variability, suggesting right atrial pressure of 3 mmHg.  IAS/Shunts: No atrial level shunt detected by color flow Doppler.   LEFT VENTRICLE PLAX 2D LVIDd:         4.30 cm   Diastology LVIDs:         2.40 cm   LV e' medial:    7.78 cm/s LV PW:         0.70 cm   LV E/e' medial:  23.7 LV IVS:        0.60 cm   LV e' lateral:   7.56 cm/s LVOT diam:     1.90 cm   LV E/e' lateral: 24.3 LV SV:         50 LV SV Index:   31 LVOT Area:     2.84 cm   RIGHT VENTRICLE             IVC RV Basal diam:  3.30 cm     IVC diam: 1.60 cm RV S prime:     18.55 cm/s TAPSE (M-mode): 2.0 cm  LEFT ATRIUM             Index        RIGHT ATRIUM          Index LA diam:        4.10 cm 2.50 cm/m   RA Area:     7.54 cm LA Vol (A2C):   42.2 ml 25.77 ml/m  RA Volume:   14.00 ml 8.55 ml/m LA Vol (A4C):   29.1 ml  17.77 ml/m LA Biplane Vol: 36.7 ml 22.42 ml/m AORTIC VALVE AV Area (Vmax):    1.23 cm AV Area (Vmean):   1.22 cm AV Area (VTI):     1.34 cm AV Vmax:           211.25 cm/s AV Vmean:          146.000 cm/s AV VTI:            0.373 m AV Peak Grad:      17.9 mmHg AV Mean Grad:      10.0 mmHg LVOT Vmax:         91.73 cm/s LVOT Vmean:        62.600 cm/s LVOT VTI:          0.176 m LVOT/AV VTI ratio: 0.47  AORTA Ao Root diam: 3.00 cm Ao Asc diam:  3.00 cm  MITRAL VALVE MV Area (PHT): 3.91 cm     SHUNTS MV Area VTI:   0.97 cm     Systemic VTI:  0.18 m MV Peak grad:  20.4 mmHg    Systemic Diam: 1.90 cm MV Mean grad:  12.0 mmHg MV Vmax:       2.26 m/s MV Vmean:      165.0 cm/s MV PHT:        101.00 msec MV Decel Time: 194 msec MV E velocity: 184.00 cm/s MV A velocity: 135.00 cm/s MV E/A ratio:  1.36  Arvilla Meres MD Electronically signed by Arvilla Meres MD Signature Date/Time: 09/07/2023/9:45:43 AM    Final     CT SCANS  CT CARDIAC SCORING (SELF PAY ONLY) 09/01/2014  Addendum 09/01/2014  6:12 PM ADDENDUM REPORT: 09/01/2014 18:09  CLINICAL DATA:  Risk stratification  EXAM: Coronary Calcium Score  TECHNIQUE: The patient was scanned on a Siemens Sensation 16 slice scanner. Axial  non-contrast 3mm slices were carried out through the heart. The data set was analyzed on a dedicated work station and scored using the Agatson method.  FINDINGS: Non-cardiac: Bilateral Breast implants Post surgical changes LLL from thoracotomy See separate report from [facility].  Ascending Aorta:  Normal 2.8 cm  Pericardium: Normal  Coronary arteries: Small area of calcification the left main artery Also noted calcification of the aortic valve  IMPRESSION: Coronary calcium score of 8. This was 96th percentile for age and sex matched control.  In setting of previous mantle radiation for non Hodgkin lymphoma and calcification of aortic valve would get echo to  r/o aortic stenosis  Charlton Haws   Electronically Signed By: Charlton Haws M.D. On: 09/01/2014 18:09  Narrative EXAM: OVER-READ INTERPRETATION  CT CHEST  The following report is an over-read performed by radiologist Dr. Charline Bills of [facility] on 09/01/2014. This over-read does not include interpretation of cardiac or coronary anatomy or pathology. The coronary calcium score interpretation by the cardiologist is attached.  COMPARISON:  Chest radiographs dated 06/02/2013  FINDINGS: Postsurgical changes related to prior left lower lobe wedge resection.  No suspicious pulmonary nodules.  Postsurgical changes in the right chest wall/breast. Bilateral breast augmentation.  No suspicious thoracic lymphadenopathy.  No focal osseous lesions.  IMPRESSION: Postsurgical changes related to prior left lower lobe wedge resection.  No significant extracardiac findings.  Electronically Signed: By: Charline Bills M.D. On: 09/01/2014 18:03          EKG:   EKG Interpretation Date/Time:  Monday September 10 2023 10:53:51 EST Ventricular Rate:  82 PR Interval:  142 QRS Duration:  80 QT Interval:  394 QTC Calculation: 460 R Axis:   24  Text Interpretation: Normal sinus rhythm Possible Left atrial enlargement No previous ECGs available Confirmed by Tonny Bollman [phone]) on 09/10/2023 11:02:07 AM    Recent Labs: No results found for requested labs within last 365 days.  Recent Lipid Panel    Component Value Date/Time   CHOL 145 04/15/2012 1020   TRIG 108 04/15/2012 1020   TRIG 175 02/24/2009 0000   HDL 68 04/15/2012 1020   CHOLHDL 2.1 04/15/2012 1020   VLDL 22 04/15/2012 1020   LDLCALC 55 04/15/2012 1020     Risk Assessment/Calculations:                Physical Exam:    VS:  BP 128/66 (BP Location: Right Arm, Patient Position: Sitting, Cuff Size: Normal)   Pulse 98   Ht 5\' 2"  (1.575 m)   Wt 142 lb (64.4 kg)   SpO2 97%   BMI  25.97 kg/m     Wt Readings from Last 3 Encounters:  09/10/23 142 lb (64.4 kg)  02/19/23 138 lb 14.2 oz (63 kg)  01/23/23 139 lb (63 kg)     GEN:  Well nourished, well developed in no acute distress HEENT: Normal NECK: No JVD; No carotid bruits LYMPHATICS: No lymphadenopathy CARDIAC: RRR, no murmurs, rubs, gallops RESPIRATORY:  Clear to auscultation without rales, wheezing or rhonchi  ABDOMEN: Soft, non-tender, non-distended MUSCULOSKELETAL:  No edema; No deformity  SKIN: Warm and dry NEUROLOGIC:  Alert and oriented x 3 PSYCHIATRIC:  Normal affect   Assessment & Plan Mild aortic stenosis Mean transvalvular gradient 10 mmHg, stable from prior echo studies.  Suggestive of mild aortic stenosis. Mild mitral stenosis Elevated transvalvular gradients but normal pressure half-time and calculated mitral valve area of 2.2 cm suggestive of mild mitral stenosis.  Severe mitral annular calcium noted. I personally reviewed her echo images and she has significant MAC likely related to her prior radiation. Recommend a repeat echo in 12 months. Continue exercise program. No indication for intervention.  Mixed hyperlipidemia Continue statin Rx. Recent labs reviewed and show ALT and AST of 40 and 43, respectively.  Creatinine 0.77.  Total cholesterol is 165, HDL 64, LDL 83, triglycerides 98. Essential hypertension BP well-controlled.     Medication Adjustments/Labs and Tests Ordered: Current medicines are reviewed at length with the patient today.  Concerns regarding medicines are outlined above.  Orders Placed This Encounter  Procedures   EKG 12-Lead   ECHOCARDIOGRAM COMPLETE   No orders of the defined types were placed in this encounter.   Patient Instructions  Testing/Procedures: ECHO (prior to next year's visit) Your physician has requested that you have an echocardiogram. Echocardiography is a painless test that uses sound waves to create images of your heart. It provides your doctor  with information about the size and shape of your heart and how well your heart's chambers and valves are working. This procedure takes approximately one hour. There are no restrictions for this procedure. Please do NOT wear cologne, perfume, aftershave, or lotions (deodorant is allowed). Please arrive 15 minutes prior to your appointment time.  Please note: We ask at that you not bring children with you during ultrasound (echo/ vascular) testing. Due to room size and safety concerns, children are not allowed in the ultrasound rooms during exams. Our front office staff cannot provide observation of children in our lobby area while testing is being conducted. An adult accompanying a patient to their appointment will only be allowed in the ultrasound room at the discretion of the ultrasound technician under special circumstances. We apologize for any inconvenience.  Follow-Up: At [facility], you and your health needs are our priority.  As part of our continuing mission to provide you with exceptional heart care, we have created designated Provider Care Teams.  These Care Teams include your primary Cardiologist (physician) and Advanced Practice Providers (APPs -  Physician Assistants and Nurse Practitioners) who all work together to provide you with the care you need, when you need it.  Your next appointment:   1 year(s)  Provider:   Tonny Bollman, MD        Signed, Tonny Bollman, MD  09/10/2023 5:08 PM    [facility]

## 2023-09-13 ENCOUNTER — Other Ambulatory Visit (HOSPITAL_COMMUNITY): Payer: Self-pay

## 2023-09-14 ENCOUNTER — Other Ambulatory Visit: Payer: Self-pay

## 2023-09-14 ENCOUNTER — Other Ambulatory Visit (HOSPITAL_COMMUNITY): Payer: Self-pay

## 2023-09-18 ENCOUNTER — Other Ambulatory Visit (HOSPITAL_COMMUNITY): Payer: Self-pay

## 2023-09-28 DIAGNOSIS — L820 Inflamed seborrheic keratosis: Secondary | ICD-10-CM | POA: Diagnosis not present

## 2023-09-28 DIAGNOSIS — C44212 Basal cell carcinoma of skin of right ear and external auricular canal: Secondary | ICD-10-CM | POA: Diagnosis not present

## 2023-09-28 DIAGNOSIS — L814 Other melanin hyperpigmentation: Secondary | ICD-10-CM | POA: Diagnosis not present

## 2023-09-28 DIAGNOSIS — L989 Disorder of the skin and subcutaneous tissue, unspecified: Secondary | ICD-10-CM | POA: Diagnosis not present

## 2023-10-18 ENCOUNTER — Other Ambulatory Visit (HOSPITAL_COMMUNITY): Payer: Self-pay

## 2023-10-18 ENCOUNTER — Other Ambulatory Visit: Payer: Self-pay

## 2023-10-19 ENCOUNTER — Other Ambulatory Visit (HOSPITAL_COMMUNITY): Payer: Self-pay

## 2023-10-19 MED ORDER — ROSUVASTATIN CALCIUM 10 MG PO TABS
10.0000 mg | ORAL_TABLET | Freq: Every day | ORAL | 3 refills | Status: DC
  Filled 2023-10-19: qty 90, 90d supply, fill #0
  Filled 2023-12-19 – 2023-12-31 (×2): qty 90, 90d supply, fill #1
  Filled 2024-05-14: qty 90, 90d supply, fill #2
  Filled 2024-08-19: qty 90, 90d supply, fill #3

## 2023-10-19 MED ORDER — BENAZEPRIL HCL 20 MG PO TABS
20.0000 mg | ORAL_TABLET | Freq: Every day | ORAL | 3 refills | Status: DC
  Filled 2023-10-19: qty 90, 90d supply, fill #0
  Filled 2023-12-19 – 2023-12-31 (×2): qty 90, 90d supply, fill #1
  Filled 2024-04-11: qty 90, 90d supply, fill #2
  Filled 2024-07-14: qty 90, 90d supply, fill #3

## 2023-10-19 MED ORDER — AMLODIPINE BESYLATE 5 MG PO TABS
5.0000 mg | ORAL_TABLET | Freq: Every day | ORAL | 3 refills | Status: DC
  Filled 2023-10-19: qty 90, 90d supply, fill #0
  Filled 2023-12-19 – 2023-12-31 (×2): qty 90, 90d supply, fill #1
  Filled 2024-04-11: qty 90, 90d supply, fill #2
  Filled 2024-07-14: qty 90, 90d supply, fill #3

## 2023-10-23 DIAGNOSIS — S01301A Unspecified open wound of right ear, initial encounter: Secondary | ICD-10-CM | POA: Diagnosis not present

## 2023-10-23 DIAGNOSIS — C44212 Basal cell carcinoma of skin of right ear and external auricular canal: Secondary | ICD-10-CM | POA: Diagnosis not present

## 2023-12-19 ENCOUNTER — Other Ambulatory Visit: Payer: Self-pay

## 2023-12-19 ENCOUNTER — Other Ambulatory Visit (HOSPITAL_COMMUNITY): Payer: Self-pay

## 2024-01-15 ENCOUNTER — Other Ambulatory Visit: Payer: Self-pay

## 2024-02-05 ENCOUNTER — Other Ambulatory Visit (HOSPITAL_COMMUNITY): Payer: Self-pay

## 2024-02-21 DIAGNOSIS — M542 Cervicalgia: Secondary | ICD-10-CM | POA: Diagnosis not present

## 2024-04-11 ENCOUNTER — Other Ambulatory Visit (HOSPITAL_COMMUNITY): Payer: Self-pay

## 2024-04-21 DIAGNOSIS — Z9013 Acquired absence of bilateral breasts and nipples: Secondary | ICD-10-CM | POA: Diagnosis not present

## 2024-05-20 DIAGNOSIS — N6325 Unspecified lump in the left breast, overlapping quadrants: Secondary | ICD-10-CM | POA: Diagnosis not present

## 2024-05-20 DIAGNOSIS — N6315 Unspecified lump in the right breast, overlapping quadrants: Secondary | ICD-10-CM | POA: Diagnosis not present

## 2024-05-22 ENCOUNTER — Other Ambulatory Visit (HOSPITAL_COMMUNITY): Payer: Self-pay

## 2024-05-22 MED ORDER — VEOZAH 45 MG PO TABS
45.0000 mg | ORAL_TABLET | Freq: Every day | ORAL | 3 refills | Status: AC
  Filled 2024-06-10: qty 90, 90d supply, fill #0
  Filled 2024-09-08: qty 90, 90d supply, fill #1

## 2024-05-22 MED ORDER — OMEPRAZOLE 20 MG PO CPDR
20.0000 mg | DELAYED_RELEASE_CAPSULE | Freq: Every day | ORAL | 3 refills | Status: AC
  Filled 2024-05-22 (×2): qty 90, 90d supply, fill #0
  Filled 2024-08-19: qty 90, 90d supply, fill #1
  Filled 2024-11-09: qty 90, 90d supply, fill #2

## 2024-05-22 MED ORDER — CLONAZEPAM 0.5 MG PO TABS
0.5000 mg | ORAL_TABLET | Freq: Two times a day (BID) | ORAL | 0 refills | Status: AC | PRN
  Filled 2024-05-22: qty 30, 15d supply, fill #0

## 2024-06-11 ENCOUNTER — Other Ambulatory Visit (HOSPITAL_COMMUNITY): Payer: Self-pay

## 2024-08-11 ENCOUNTER — Other Ambulatory Visit (HOSPITAL_COMMUNITY): Payer: Commercial Managed Care - PPO

## 2024-08-29 ENCOUNTER — Ambulatory Visit (HOSPITAL_COMMUNITY)
Admission: RE | Admit: 2024-08-29 | Discharge: 2024-08-29 | Disposition: A | Discharge status: Home / Self Care | Payer: Commercial Managed Care - PPO | Source: Ambulatory Visit | Attending: Internal Medicine | Admitting: Internal Medicine

## 2024-08-29 DIAGNOSIS — I350 Nonrheumatic aortic (valve) stenosis: Secondary | ICD-10-CM | POA: Diagnosis not present

## 2024-08-29 DIAGNOSIS — I10 Essential (primary) hypertension: Secondary | ICD-10-CM

## 2024-08-29 DIAGNOSIS — I050 Rheumatic mitral stenosis: Secondary | ICD-10-CM | POA: Diagnosis not present

## 2024-08-29 DIAGNOSIS — E782 Mixed hyperlipidemia: Secondary | ICD-10-CM | POA: Diagnosis not present

## 2024-08-29 LAB — ECHOCARDIOGRAM COMPLETE
AR max vel: 1.42 cm2
AV Area VTI: 1.33 cm2
AV Area mean vel: 1.38 cm2
AV Mean grad: 14 mmHg
AV Peak grad: 24.5 mmHg
Ao pk vel: 2.47 m/s
Area-P 1/2: 2.49 cm2
MV M vel: 4.97 m/s
MV Peak grad: 98.6 mmHg
MV VTI: 1.47 cm2
P 1/2 time: 392 ms
S' Lateral: 2.25 cm

## 2024-08-31 ENCOUNTER — Ambulatory Visit: Payer: Self-pay | Admitting: Cardiovascular Disease

## 2024-09-02 ENCOUNTER — Ambulatory Visit: Attending: Cardiovascular Disease | Admitting: Cardiovascular Disease

## 2024-09-02 ENCOUNTER — Encounter: Payer: Self-pay | Admitting: Cardiovascular Disease

## 2024-09-02 VITALS — BP 141/77 | HR 91 | Ht 63.0 in | Wt 147.0 lb

## 2024-09-02 DIAGNOSIS — E782 Mixed hyperlipidemia: Secondary | ICD-10-CM | POA: Diagnosis not present

## 2024-09-02 DIAGNOSIS — E039 Hypothyroidism, unspecified: Secondary | ICD-10-CM | POA: Diagnosis not present

## 2024-09-02 DIAGNOSIS — I349 Nonrheumatic mitral valve disorder, unspecified: Secondary | ICD-10-CM | POA: Diagnosis not present

## 2024-09-02 DIAGNOSIS — R0989 Other specified symptoms and signs involving the circulatory and respiratory systems: Secondary | ICD-10-CM | POA: Diagnosis not present

## 2024-09-02 DIAGNOSIS — I350 Nonrheumatic aortic (valve) stenosis: Secondary | ICD-10-CM

## 2024-09-02 DIAGNOSIS — E559 Vitamin D deficiency, unspecified: Secondary | ICD-10-CM | POA: Diagnosis not present

## 2024-09-02 DIAGNOSIS — R7309 Other abnormal glucose: Secondary | ICD-10-CM | POA: Diagnosis not present

## 2024-09-02 DIAGNOSIS — I10 Essential (primary) hypertension: Secondary | ICD-10-CM

## 2024-09-02 DIAGNOSIS — E7800 Pure hypercholesterolemia, unspecified: Secondary | ICD-10-CM | POA: Diagnosis not present

## 2024-09-02 DIAGNOSIS — Z0000 Encounter for general adult medical examination without abnormal findings: Secondary | ICD-10-CM | POA: Diagnosis not present

## 2024-09-02 DIAGNOSIS — I050 Rheumatic mitral stenosis: Secondary | ICD-10-CM

## 2024-09-02 NOTE — Progress Notes (Signed)
 e

## 2024-09-02 NOTE — Assessment & Plan Note (Signed)
 Continue amlodipine and benazepril

## 2024-09-02 NOTE — Patient Instructions (Signed)
 Medication Instructions:  No medication changes were made at this visit. Continue current regimen.   *If you need a refill on your cardiac medications before your next appointment, please call your pharmacy*  Lab Work: None ordered today. If you have labs (blood work) drawn today and your tests are completely normal, you will receive your results only by: MyChart Message (if you have MyChart) OR A paper copy in the mail If you have any lab test that is abnormal or we need to change your treatment, we will call you to review the results.  Testing/Procedures: Your physician has requested that you have a carotid duplex. This test is an ultrasound of the carotid arteries in your neck. It looks at blood flow through these arteries that supply the brain with blood. Allow one hour for this exam. There are no restrictions or special instructions.  Follow-Up: At [facility], you and your health needs are our priority.  As part of our continuing mission to provide you with exceptional heart care, our providers are all part of one team.  This team includes your primary Cardiologist (physician) and Advanced Practice Providers or APPs (Physician Assistants and Nurse Practitioners) who all work together to provide you with the care you need, when you need it.  Your next appointment:   1 year(s)  Provider:   Ozell Fell, MD

## 2024-09-02 NOTE — Progress Notes (Signed)
 Cardiology Office Note:    Date:  09/02/2024   ID:  [name], DOB 05/22/1971, MRN [id number]  PCP:  Gerome Brunet, DO   [facility] Providers Cardiologist:  Ozell Fell, MD     Referring MD: Gerome Brunet, DO   Chief Complaint  Patient presents with   Follow-up    Valvular heart disease    History of Present Illness:    Brooke Gomez is a 53 y.o. female presenting for follow-up of valvular heart disease. The patient has a history of Hodgkin's lymphoma treated with thoracotomy, chemotherapy, and radiation remotely. She has been followed for mild aortic and mitral stenosis thought to be related to her past history of mediastinal radiation therapy. Other problems include hypertension and mixed hyperlipidemia.   Labs 02/13/2024: Chol 142, HDL 64, LDL 65. HgB A1C 5.9. AST/ALT 38/32. Creatinine 0.73.   The patient is here alone today.  She reports mild dyspnea with exertion when she walks at a brisk pace but states that this has been stable over time.  She has no symptoms with any normal activities and she is able to walk on a treadmill for exercise without problems.  She denies orthopnea, PND, or chest pain.  She denies edema or heart palpitations.  Her mom has been sick with metastatic breast cancer and she is in the home hospice.  Her dad is in reasonably good health and is caring for her mother.  Current Medications: Current Meds  Medication Sig   amLODipine  (NORVASC ) 5 MG tablet Take 1 tablet (5 mg total) by mouth daily.   aspirin 81 MG tablet Take 81 mg by mouth every Monday, Wednesday, and Friday.   benazepril  (LOTENSIN ) 20 MG tablet Take 1 tablet (20 mg total) by mouth daily.   Calcium  Carb-Cholecalciferol (CALCIUM  500 + D3) 500-5 MG-MCG TABS 1 mg daily.   Calcium  Carbonate-Vitamin D (CALTRATE 600+D PO) Take 1 tablet by mouth 2 (two) times daily.   clonazePAM  (KLONOPIN ) 0.5 MG tablet Take 1 tablet (0.5 mg total) by mouth 2 (two) times daily as needed.    diphenhydrAMINE (BENADRYL ALLERGY) 25 MG tablet 25 mg at bedtime as needed.   Fezolinetant  (VEOZAH ) 45 MG TABS Take 1 tablet (45 mg total) by mouth daily.   levothyroxine  (SYNTHROID ) 125 MCG tablet Take 1 tablet (125 mcg total) by mouth in the morning on an empty stomach   Multiple Vitamins-Minerals (MULTIVITAMIN PO) Take 1 tablet by mouth daily.   Omega-3 Fatty Acids (FISH OIL) 1200 MG CAPS Take 1 capsule by mouth 2 (two) times daily.   omeprazole  (PRILOSEC) 20 MG capsule Take 1 capsule (20 mg total) by mouth daily 30 minutes to 1 hour before morning meal.   rosuvastatin  (CRESTOR ) 10 MG tablet Take 1 tablet (10 mg total) by mouth daily.   valACYclovir  (VALTREX ) 1000 MG tablet Take 2 tablets (2,000 mg total) by mouth every 12 (twelve) hours for 1 day as needed   zolpidem  (AMBIEN ) 5 MG tablet Take 1 tablet (5 mg total) by mouth at bedtime as needed.     Allergies:   Penicillins, Cephalosporins, and Chlorhexidine gluconate   ROS:   Please see the history of present illness.    All other systems reviewed and are negative.  EKGs/Labs/Other Studies Reviewed:    The following studies were reviewed today: Cardiac Studies & Procedures   ______________________________________________________________________________________________     ECHOCARDIOGRAM  ECHOCARDIOGRAM COMPLETE 08/29/2024  Narrative ECHOCARDIOGRAM REPORT    Patient Name:   Brooke Gomez  Date of Exam: 08/29/2024 Medical Rec #:  [id number]        Height:       62.0 in Accession #:    [id number]       Weight:       142.0 lb Date of Birth:  [birth year]       BSA:          1.653 m Patient Age:    52 years         BP:           138/84 mmHg Patient Gender: F                HR:           88 bpm. Exam Location:  [address]  Procedure: 2D Echo, 3D Echo, Cardiac Doppler, Color Doppler and Strain Analysis (Both Spectral and Color Flow Doppler were utilized during procedure).  Indications:    I35.0 Aortic Stenosis I34.1  Mitral Stenosis  History:        Patient has prior history of Echocardiogram examinations, most recent 09/07/2023. Aortic Valve Disease and Mitral Valve Disease, Signs/Symptoms:Dyspnea; Risk Factors:Hypertension and Dyslipidemia. Hodgkin's Lymphoma (age 1) status post Thoracotomy, Radiation and Chemotherapy, Breast Cancer with Bilateral Mastectomy (2000 and 2002) with Bilateral Saline Reconstruction Mild Aortic and Mitral Stenosis secondary to Radiation.  Sonographer:    Heather Hawks RDCS Referring Phys: GEROME BRUNET  IMPRESSIONS   1. Left ventricular ejection fraction, by estimation, is 60 to 65%. Left ventricular ejection fraction by 3D volume is 64 %. The left ventricle has normal function. The left ventricle has no regional wall motion abnormalities. Left ventricular diastolic parameters are indeterminate. The average left ventricular global longitudinal strain is -22.1 %. The global longitudinal strain is normal. 2. Right ventricular systolic function is normal. The right ventricular size is normal. 3. The mitral valve is degenerative. Trivial mitral valve regurgitation. Mild mitral stenosis. The mean mitral valve gradient is 5.0 mmHg. Severe mitral annular calcification. 4. The aortic valve is calcified. There is moderate calcification of the aortic valve. There is mild thickening of the aortic valve. Aortic valve regurgitation is mild. Mild to moderate aortic valve stenosis. Aortic valve area, by VTI measures 1.33 cm. Aortic valve mean gradient measures 14.0 mmHg. Aortic valve Vmax measures 2.47 m/s. 5. The inferior vena cava is normal in size with greater than 50% respiratory variability, suggesting right atrial pressure of 3 mmHg.  FINDINGS Left Ventricle: Left ventricular ejection fraction, by estimation, is 60 to 65%. Left ventricular ejection fraction by 3D volume is 64 %. The left ventricle has normal function. The left ventricle has no regional wall motion  abnormalities. The average left ventricular global longitudinal strain is -22.1 %. Strain was performed and the global longitudinal strain is normal. The left ventricular internal cavity size was normal in size. There is no left ventricular hypertrophy. Left ventricular diastolic parameters are indeterminate.  Right Ventricle: The right ventricular size is normal. No increase in right ventricular wall thickness. Right ventricular systolic function is normal.  Left Atrium: Left atrial size was normal in size.  Right Atrium: Right atrial size was normal in size.  Pericardium: There is no evidence of pericardial effusion.  Mitral Valve: The mitral valve is degenerative in appearance. Severe mitral annular calcification. Trivial mitral valve regurgitation. Mild mitral valve stenosis. MV peak gradient, 12.7 mmHg. The mean mitral valve gradient is 5.0 mmHg.  Tricuspid Valve: The tricuspid valve is normal in structure. Tricuspid valve regurgitation is not  demonstrated. No evidence of tricuspid stenosis.  Aortic Valve: The aortic valve is calcified. There is moderate calcification of the aortic valve. There is mild thickening of the aortic valve. Aortic valve regurgitation is mild. Aortic regurgitation PHT measures 392 msec. Mild to moderate aortic stenosis is present. Aortic valve mean gradient measures 14.0 mmHg. Aortic valve peak gradient measures 24.5 mmHg. Aortic valve area, by VTI measures 1.33 cm.  Pulmonic Valve: The pulmonic valve was normal in structure. Pulmonic valve regurgitation is trivial. No evidence of pulmonic stenosis.  Aorta: The aortic root is normal in size and structure.  Venous: The inferior vena cava is normal in size with greater than 50% respiratory variability, suggesting right atrial pressure of 3 mmHg.  IAS/Shunts: No atrial level shunt detected by color flow Doppler.  Additional Comments: 3D was performed not requiring image post processing on an independent  workstation and was normal.   LEFT VENTRICLE PLAX 2D LVIDd:         3.60 cm         Diastology LVIDs:         2.25 cm         LV e' medial:    5.88 cm/s LV PW:         1.05 cm         LV E/e' medial:  24.9 LV IVS:        0.80 cm         LV e' lateral:   5.38 cm/s LVOT diam:     2.40 cm         LV E/e' lateral: 27.2 LV SV:         75 LV SV Index:   45              2D Longitudinal LVOT Area:     4.52 cm        Strain LV IVRT:       74 msec         2D Strain GLS   -19.1 % (A4C): 2D Strain GLS   -22.7 % (A3C): 2D Strain GLS   -24.7 % (A2C): 2D Strain GLS   -22.1 % Avg:  3D Volume EF LV 3D EF:    Left ventricul ar ejection fraction by 3D volume is 64 %.  3D Volume EF: 3D EF:        64 % LV EDV:       83 ml LV ESV:       30 ml LV SV:        53 ml  RIGHT VENTRICLE RV Basal diam:  3.10 cm     PULMONARY VEINS RV S prime:     15.30 cm/s  Diastolic Velocity: 50.20 cm/s TAPSE (M-mode): 2.7 cm      S/D Velocity:       1.10 Systolic Velocity:  57.60 cm/s  LEFT ATRIUM             Index        RIGHT ATRIUM           Index LA diam:        3.25 cm 1.97 cm/m   RA Pressure: 3.00 mmHg LA Vol (A2C):   50.0 ml 30.25 ml/m  RA Area:     12.00 cm LA Vol (A4C):   49.3 ml 29.83 ml/m  RA Volume:   28.20 ml  17.06 ml/m LA Biplane Vol: 50.2 ml 30.37 ml/m AORTIC VALVE AV Area (Vmax):  1.42 cm AV Area (Vmean):   1.38 cm AV Area (VTI):     1.33 cm AV Vmax:           247.25 cm/s AV Vmean:          172.500 cm/s AV VTI:            0.562 m AV Peak Grad:      24.5 mmHg AV Mean Grad:      14.0 mmHg LVOT Vmax:         77.40 cm/s LVOT Vmean:        52.800 cm/s LVOT VTI:          0.166 m LVOT/AV VTI ratio: 0.29 AI PHT:            392 msec  AORTA Ao Root diam: 2.80 cm Ao Asc diam:  3.00 cm  MITRAL VALVE                TRICUSPID VALVE MV Area (PHT): cm          Estimated RAP:  3.00 mmHg MV Area VTI:   1.47 cm MV Peak grad:  12.7 mmHg    SHUNTS MV Mean grad:  5.0 mmHg      Systemic VTI:  0.17 m MV Vmax:       1.78 m/s     Systemic Diam: 2.40 cm MV Vmean:      103.0 cm/s MV Decel Time: 305 msec MR Peak grad: 98.6 mmHg MR Mean grad: 60.5 mmHg MR Vmax:      496.50 cm/s MR Vmean:     362.5 cm/s MV E velocity: 146.50 cm/s MV A velocity: 124.50 cm/s MV E/A ratio:  1.18  Oneil Parchment MD Electronically signed by Oneil Parchment MD Signature Date/Time: 08/29/2024/12:42:07 PM    Final      CT SCANS  CT CARDIAC SCORING (SELF PAY ONLY) 09/01/2014  Addendum 09/01/2014  6:12 PM ADDENDUM REPORT: 09/01/2014 18:09  CLINICAL DATA:  Risk stratification  EXAM: Coronary Calcium  Score  TECHNIQUE: The patient was scanned on a Siemens Sensation 16 slice scanner. Axial non-contrast 3mm slices were carried out through the heart. The data set was analyzed on a dedicated work station and scored using the Agatson method.  FINDINGS: Non-cardiac: Bilateral Breast implants Post surgical changes LLL from thoracotomy See separate report from [facility].  Ascending Aorta:  Normal 2.8 cm  Pericardium: Normal  Coronary arteries: Small area of calcification the left main artery Also noted calcification of the aortic valve  IMPRESSION: Coronary calcium  score of 8. This was 96th percentile for age and sex matched control.  In setting of previous mantle radiation for non Hodgkin lymphoma and calcification of aortic valve would get echo to r/o aortic stenosis  Maude Emmer   Electronically Signed By: Maude Emmer M.D. On: 09/01/2014 18:09  Narrative EXAM: OVER-READ INTERPRETATION  CT CHEST  The following report is an over-read performed by radiologist Dr. Pinkie Pebbles of [facility] on 09/01/2014. This over-read does not include interpretation of cardiac or coronary anatomy or pathology. The coronary calcium  score interpretation by the cardiologist is attached.  COMPARISON:  Chest radiographs dated  06/02/2013  FINDINGS: Postsurgical changes related to prior left lower lobe wedge resection.  No suspicious pulmonary nodules.  Postsurgical changes in the right chest wall/breast. Bilateral breast augmentation.  No suspicious thoracic lymphadenopathy.  No focal osseous lesions.  IMPRESSION: Postsurgical changes related to prior left lower lobe wedge resection.  No significant extracardiac findings.  Electronically Signed: By: Pinkie Pebbles M.D. On: 09/01/2014 18:03     ______________________________________________________________________________________________      EKG:   EKG Interpretation Date/Time:  Tuesday September 02 2024 11:05:47 EST Ventricular Rate:  91 PR Interval:  144 QRS Duration:  90 QT Interval:  380 QTC Calculation: 467 R Axis:   37  Text Interpretation: Normal sinus rhythm Possible Left atrial enlargement When compared with ECG of 10-Sep-2023 10:53, No significant change was found Confirmed by Wonda Sharper [phone]) on 09/02/2024 11:26:28 AM    Recent Labs: No results found for requested labs within last 365 days.  Recent Lipid Panel    Component Value Date/Time   CHOL 145 04/15/2012 1020   TRIG 108 04/15/2012 1020   TRIG 175 02/24/2009 0000   HDL 68 04/15/2012 1020   CHOLHDL 2.1 04/15/2012 1020   VLDL 22 04/15/2012 1020   LDLCALC 55 04/15/2012 1020         Physical Exam:    VS:  BP (!) 141/77 (BP Location: Right Arm, Patient Position: Sitting, Cuff Size: Normal)   Pulse 91   Ht 5' 3 (1.6 m)   Wt 147 lb (66.7 kg)   SpO2 96%   BMI 26.04 kg/m     Wt Readings from Last 3 Encounters:  09/02/24 147 lb (66.7 kg)  09/10/23 142 lb (64.4 kg)  02/19/23 138 lb 14.2 oz (63 kg)     GEN:  Well nourished, well developed in no acute distress HEENT: Normal NECK: No JVD; bilateral carotid bruits LYMPHATICS: No lymphadenopathy CARDIAC: RRR, 2/6 early crescendo decrescendo murmur at the right upper sternal border RESPIRATORY:  Clear  to auscultation without rales, wheezing or rhonchi  ABDOMEN: Soft, non-tender, non-distended MUSCULOSKELETAL:  No edema; No deformity  SKIN: Warm and dry NEUROLOGIC:  Alert and oriented x 3 PSYCHIATRIC:  Normal affect   Assessment & Plan Mild mitral stenosis Continue clinical follow-up in 1 year with plans for repeat echocardiogram in 2 years.  Transmitral gradients have been stable.  Her most recent echo shows a mean gradient of 5 mmHg. Mixed hyperlipidemia Treated with rosuvastatin .  Most recent labs are excellent with LDL cholesterol at goal as outlined in the HPI.  Her labs are personally reviewed today. Essential hypertension Continue amlodipine  and benazepril . Bilateral carotid bruits With her history of radiation, recommend carotid ultrasound.  Last carotid duplex scan from 2020 was reviewed and showed no evidence of high-grade obstruction. Mild aortic stenosis Patient with radiation-induced valvular heart disease.  Recent echo reviewed with mean transaortic gradient of 14 mmHg and calculated aortic valve area of 1.4 cm.  This is stable compared to her prior study 1 year ago.  Will plan on clinical follow-up in 1 year and repeat echocardiogram in 2 years unless her exam or symptoms change.            Medication Adjustments/Labs and Tests Ordered: Current medicines are reviewed at length with the patient today.  Concerns regarding medicines are outlined above.  Orders Placed This Encounter  Procedures   EKG 12-Lead   VAS US  CAROTID   No orders of the defined types were placed in this encounter.   Patient Instructions  Medication Instructions:  No medication changes were made at this visit. Continue current regimen.   *If you need a refill on your cardiac medications before your next appointment, please call your pharmacy*  Lab Work: None ordered today. If you have labs (blood work) drawn today and your tests are completely normal, you will  receive your results only  by: MyChart Message (if you have MyChart) OR A paper copy in the mail If you have any lab test that is abnormal or we need to change your treatment, we will call you to review the results.  Testing/Procedures: Your physician has requested that you have a carotid duplex. This test is an ultrasound of the carotid arteries in your neck. It looks at blood flow through these arteries that supply the brain with blood. Allow one hour for this exam. There are no restrictions or special instructions.  Follow-Up: At [facility], you and your health needs are our priority.  As part of our continuing mission to provide you with exceptional heart care, our providers are all part of one team.  This team includes your primary Cardiologist (physician) and Advanced Practice Providers or APPs (Physician Assistants and Nurse Practitioners) who all work together to provide you with the care you need, when you need it.  Your next appointment:   1 year(s)  Provider:   Ozell Fell, MD      Signed, Ozell Fell, MD  09/02/2024 6:16 PM     [facility]

## 2024-09-02 NOTE — Assessment & Plan Note (Signed)
 Treated with rosuvastatin .  Most recent labs are excellent with LDL cholesterol at goal as outlined in the HPI.  Her labs are personally reviewed today.

## 2024-09-08 ENCOUNTER — Other Ambulatory Visit (HOSPITAL_COMMUNITY): Payer: Self-pay

## 2024-09-08 DIAGNOSIS — Z9081 Acquired absence of spleen: Secondary | ICD-10-CM | POA: Diagnosis not present

## 2024-09-08 DIAGNOSIS — Z0000 Encounter for general adult medical examination without abnormal findings: Secondary | ICD-10-CM | POA: Diagnosis not present

## 2024-09-08 DIAGNOSIS — Z8579 Personal history of other malignant neoplasms of lymphoid, hematopoietic and related tissues: Secondary | ICD-10-CM | POA: Diagnosis not present

## 2024-09-08 DIAGNOSIS — Z853 Personal history of malignant neoplasm of breast: Secondary | ICD-10-CM | POA: Diagnosis not present

## 2024-09-08 MED ORDER — ZOLPIDEM TARTRATE 5 MG PO TABS
5.0000 mg | ORAL_TABLET | Freq: Every day | ORAL | 2 refills | Status: AC
  Filled 2024-09-08: qty 30, 30d supply, fill #0

## 2024-09-15 ENCOUNTER — Ambulatory Visit (HOSPITAL_COMMUNITY)
Admission: RE | Admit: 2024-09-15 | Discharge: 2024-09-15 | Disposition: A | Discharge status: Home / Self Care | Source: Ambulatory Visit | Attending: Cardiovascular Disease | Admitting: Cardiovascular Disease

## 2024-09-15 DIAGNOSIS — R0989 Other specified symptoms and signs involving the circulatory and respiratory systems: Secondary | ICD-10-CM | POA: Diagnosis not present

## 2024-09-21 ENCOUNTER — Ambulatory Visit: Payer: Self-pay | Admitting: Cardiovascular Disease

## 2024-10-10 ENCOUNTER — Other Ambulatory Visit (HOSPITAL_COMMUNITY): Payer: Self-pay

## 2024-10-11 ENCOUNTER — Other Ambulatory Visit (HOSPITAL_COMMUNITY): Payer: Self-pay

## 2024-10-11 MED ORDER — BENAZEPRIL HCL 20 MG PO TABS
20.0000 mg | ORAL_TABLET | Freq: Every day | ORAL | 3 refills | Status: AC
  Filled 2024-10-11: qty 90, 90d supply, fill #0

## 2024-10-11 MED ORDER — AMLODIPINE BESYLATE 5 MG PO TABS
5.0000 mg | ORAL_TABLET | Freq: Every day | ORAL | 3 refills | Status: AC
  Filled 2024-10-11: qty 90, 90d supply, fill #0

## 2024-10-13 ENCOUNTER — Other Ambulatory Visit (HOSPITAL_COMMUNITY): Payer: Self-pay

## 2024-11-12 ENCOUNTER — Other Ambulatory Visit (HOSPITAL_COMMUNITY): Payer: Self-pay

## 2024-11-13 ENCOUNTER — Encounter (HOSPITAL_COMMUNITY): Payer: Self-pay

## 2024-11-13 ENCOUNTER — Other Ambulatory Visit (HOSPITAL_COMMUNITY): Payer: Self-pay

## 2024-11-13 MED ORDER — ROSUVASTATIN CALCIUM 10 MG PO TABS
10.0000 mg | ORAL_TABLET | Freq: Every day | ORAL | 3 refills | Status: AC
  Filled 2024-11-13: qty 90, 90d supply, fill #0
